# Patient Record
Sex: Female | Born: 1960 | Race: White | Hispanic: No | Marital: Married | Smoking: Never smoker
Health system: Southern US, Community
[De-identification: ages and names within clinical notes are randomized; demographics above are authoritative.]

## PROBLEM LIST (undated history)

## (undated) DIAGNOSIS — I639 Cerebral infarction, unspecified: Secondary | ICD-10-CM

## (undated) DIAGNOSIS — I10 Essential (primary) hypertension: Secondary | ICD-10-CM

## (undated) DIAGNOSIS — E7800 Pure hypercholesterolemia, unspecified: Secondary | ICD-10-CM

## (undated) DIAGNOSIS — G459 Transient cerebral ischemic attack, unspecified: Secondary | ICD-10-CM

---

## 1999-05-29 ENCOUNTER — Other Ambulatory Visit: Admission: RE | Admit: 1999-05-29 | Discharge: 1999-05-29 | Payer: Self-pay | Admitting: Obstetrics and Gynecology

## 1999-06-20 ENCOUNTER — Ambulatory Visit (HOSPITAL_COMMUNITY): Admission: RE | Admit: 1999-06-20 | Discharge: 1999-06-20 | Payer: Self-pay | Admitting: Obstetrics and Gynecology

## 2003-08-07 ENCOUNTER — Inpatient Hospital Stay (HOSPITAL_COMMUNITY): Admission: AD | Admit: 2003-08-07 | Discharge: 2003-08-10 | Payer: Self-pay | Admitting: Internal Medicine

## 2003-10-30 ENCOUNTER — Inpatient Hospital Stay (HOSPITAL_COMMUNITY): Admission: RE | Admit: 2003-10-30 | Discharge: 2003-11-04 | Payer: Self-pay | Admitting: Internal Medicine

## 2011-03-13 ENCOUNTER — Emergency Department (HOSPITAL_COMMUNITY)
Admission: EM | Admit: 2011-03-13 | Discharge: 2011-03-14 | Disposition: A | Discharge status: Home / Self Care | Payer: 59 | Attending: Emergency Medicine | Admitting: Emergency Medicine

## 2011-03-13 ENCOUNTER — Emergency Department (HOSPITAL_COMMUNITY): Payer: 59

## 2011-03-13 DIAGNOSIS — E785 Hyperlipidemia, unspecified: Secondary | ICD-10-CM | POA: Insufficient documentation

## 2011-03-13 DIAGNOSIS — R079 Chest pain, unspecified: Secondary | ICD-10-CM | POA: Insufficient documentation

## 2011-03-13 DIAGNOSIS — R0989 Other specified symptoms and signs involving the circulatory and respiratory systems: Secondary | ICD-10-CM | POA: Insufficient documentation

## 2011-03-13 DIAGNOSIS — M25519 Pain in unspecified shoulder: Secondary | ICD-10-CM | POA: Insufficient documentation

## 2011-03-13 DIAGNOSIS — R42 Dizziness and giddiness: Secondary | ICD-10-CM | POA: Insufficient documentation

## 2011-03-13 DIAGNOSIS — R05 Cough: Secondary | ICD-10-CM | POA: Insufficient documentation

## 2011-03-13 DIAGNOSIS — R5381 Other malaise: Secondary | ICD-10-CM | POA: Insufficient documentation

## 2011-03-13 DIAGNOSIS — R110 Nausea: Secondary | ICD-10-CM | POA: Insufficient documentation

## 2011-03-13 DIAGNOSIS — R0609 Other forms of dyspnea: Secondary | ICD-10-CM | POA: Insufficient documentation

## 2011-03-13 DIAGNOSIS — R059 Cough, unspecified: Secondary | ICD-10-CM | POA: Insufficient documentation

## 2011-03-13 DIAGNOSIS — R55 Syncope and collapse: Secondary | ICD-10-CM | POA: Insufficient documentation

## 2011-03-13 LAB — CBC
HCT: 38.5 % (ref 36.0–46.0)
MCH: 28.3 pg (ref 26.0–34.0)
MCHC: 33.5 g/dL (ref 30.0–36.0)
RBC: 4.56 MIL/uL (ref 3.87–5.11)
WBC: 10.9 10*3/uL — ABNORMAL HIGH (ref 4.0–10.5)

## 2011-03-13 LAB — BASIC METABOLIC PANEL
BUN: 9 mg/dL (ref 6–23)
Creatinine, Ser: 0.78 mg/dL (ref 0.50–1.10)
Glucose, Bld: 99 mg/dL (ref 70–99)
Potassium: 3.5 mEq/L (ref 3.5–5.1)
Sodium: 140 mEq/L (ref 135–145)

## 2011-03-13 LAB — TROPONIN I
Troponin I: <0.3 ng/mL (ref <0.30)
Troponin I: <0.3 ng/mL (ref <0.30)

## 2011-03-13 LAB — POCT PREGNANCY, URINE: Preg Test, Ur: NEGATIVE

## 2015-11-30 DIAGNOSIS — G459 Transient cerebral ischemic attack, unspecified: Secondary | ICD-10-CM

## 2015-11-30 HISTORY — DX: Transient cerebral ischemic attack, unspecified: G45.9

## 2016-04-29 ENCOUNTER — Emergency Department (HOSPITAL_COMMUNITY)
Admission: EM | Admit: 2016-04-29 | Discharge: 2016-04-29 | Disposition: A | Discharge status: Home / Self Care | Payer: Commercial Managed Care - HMO | Attending: Emergency Medicine | Admitting: Emergency Medicine

## 2016-04-29 ENCOUNTER — Emergency Department (HOSPITAL_COMMUNITY): Payer: Commercial Managed Care - HMO

## 2016-04-29 ENCOUNTER — Encounter (HOSPITAL_COMMUNITY): Payer: Self-pay | Admitting: Emergency Medicine

## 2016-04-29 DIAGNOSIS — R200 Anesthesia of skin: Secondary | ICD-10-CM | POA: Diagnosis not present

## 2016-04-29 DIAGNOSIS — Z7982 Long term (current) use of aspirin: Secondary | ICD-10-CM | POA: Diagnosis not present

## 2016-04-29 DIAGNOSIS — I10 Essential (primary) hypertension: Secondary | ICD-10-CM

## 2016-04-29 DIAGNOSIS — R791 Abnormal coagulation profile: Secondary | ICD-10-CM | POA: Diagnosis not present

## 2016-04-29 DIAGNOSIS — Z8673 Personal history of transient ischemic attack (TIA), and cerebral infarction without residual deficits: Secondary | ICD-10-CM | POA: Insufficient documentation

## 2016-04-29 HISTORY — DX: Transient cerebral ischemic attack, unspecified: G45.9

## 2016-04-29 LAB — COMPREHENSIVE METABOLIC PANEL
ALBUMIN: 4.2 g/dL (ref 3.5–5.0)
ALT: 15 U/L (ref 14–54)
AST: 18 U/L (ref 15–41)
Alkaline Phosphatase: 73 U/L (ref 38–126)
Anion gap: 9 (ref 5–15)
BUN: 13 mg/dL (ref 6–20)
CHLORIDE: 105 mmol/L (ref 101–111)
CO2: 25 mmol/L (ref 22–32)
CREATININE: 0.74 mg/dL (ref 0.44–1.00)
Calcium: 9.9 mg/dL (ref 8.9–10.3)
GFR calc Af Amer: >60 mL/min (ref >60)
GFR calc non Af Amer: >60 mL/min (ref >60)
GLUCOSE: 85 mg/dL (ref 65–99)
POTASSIUM: 3.9 mmol/L (ref 3.5–5.1)
SODIUM: 139 mmol/L (ref 135–145)
Total Bilirubin: 0.7 mg/dL (ref 0.3–1.2)
Total Protein: 6.7 g/dL (ref 6.5–8.1)

## 2016-04-29 LAB — DIFFERENTIAL
BASOS ABS: 0.1 10*3/uL (ref 0.0–0.1)
BASOS PCT: 1 %
EOS ABS: 0.1 10*3/uL (ref 0.0–0.7)
Eosinophils Relative: 1 %
Lymphocytes Relative: 33 %
Lymphs Abs: 3.2 10*3/uL (ref 0.7–4.0)
MONOS PCT: 6 %
Monocytes Absolute: 0.6 10*3/uL (ref 0.1–1.0)
NEUTROS ABS: 6 10*3/uL (ref 1.7–7.7)
NEUTROS PCT: 59 %

## 2016-04-29 LAB — I-STAT TROPONIN, ED: Troponin i, poc: 0 ng/mL (ref 0.00–0.08)

## 2016-04-29 LAB — PROTIME-INR
INR: 0.9
Prothrombin Time: 12.1 seconds (ref 11.4–15.2)

## 2016-04-29 LAB — CBC
HEMATOCRIT: 43.7 % (ref 36.0–46.0)
Hemoglobin: 13.7 g/dL (ref 12.0–15.0)
MCH: 27.8 pg (ref 26.0–34.0)
MCHC: 31.4 g/dL (ref 30.0–36.0)
MCV: 88.6 fL (ref 78.0–100.0)
PLATELETS: 311 10*3/uL (ref 150–400)
RBC: 4.93 MIL/uL (ref 3.87–5.11)
RDW: 12.8 % (ref 11.5–15.5)
WBC: 10 10*3/uL (ref 4.0–10.5)

## 2016-04-29 LAB — I-STAT CHEM 8, ED
BUN: 15 mg/dL (ref 6–20)
CHLORIDE: 104 mmol/L (ref 101–111)
Calcium, Ion: 1.2 mmol/L (ref 1.15–1.40)
Creatinine, Ser: 0.8 mg/dL (ref 0.44–1.00)
GLUCOSE: 87 mg/dL (ref 65–99)
HEMATOCRIT: 43 % (ref 36.0–46.0)
Hemoglobin: 14.6 g/dL (ref 12.0–15.0)
POTASSIUM: 4 mmol/L (ref 3.5–5.1)
Sodium: 140 mmol/L (ref 135–145)
TCO2: 27 mmol/L (ref 0–100)

## 2016-04-29 LAB — APTT: APTT: 28 s (ref 24–36)

## 2016-04-29 LAB — ETHANOL

## 2016-04-29 LAB — CBG MONITORING, ED: GLUCOSE-CAPILLARY: 94 mg/dL (ref 65–99)

## 2016-04-29 NOTE — ED Triage Notes (Signed)
Pt in from work, brought via [facility] after stroke-like symptoms began at 1000. Per EMS, pt stated having R sided facial numbness/tingling, sharp R sided HA and staggered gait at that time. Hx of TIA's, last one in April. Pt arrives alert, oriented, breathing with no difficulties.

## 2016-04-29 NOTE — ED Provider Notes (Signed)
[facility]EM[facility]GENCY DEPT Provider Note   CSN: [id number] Arrival date & time: 04/29/16  1034     History   Chief Complaint Chief Complaint  Patient presents with  . Transient Ischemic Attack    HPI Hailey Barrera is a 55 y.o. female.  Pt at work today and had some right sided facial numbness.  The pt said she became very worried and started breathing fast.  She said that she could not walk to the on site medical clinic and someone had to help her.  She called EMS who called a code stroke.  The pt said the same sx happened to her in April where she was diagnosed with a TIA.  She was admitted to [facility] had had a full stroke work up including MRI head, carotid u/s and echo.  She was diagnosed with htn and hypertriglyceridemia and has been on meds for those problems since d/c.  She has been followed closely by her pcp.  Pt did say that her bp was elevated at her work.      Past Medical History:  Diagnosis Date  . TIA (transient ischemic attack) 11/2015    There are no active problems to display for this patient.   No past surgical history on file.  OB History    No data available       Home Medications    Prior to Admission medications   Medication Sig Start Date End Date Taking? Authorizing Provider  aspirin EC 81 MG tablet Take 81 mg by mouth daily. 02/10/16  Yes Historical Provider, MD  cetirizine (ZYRTEC) 10 MG tablet Take 10 mg by mouth daily.   Yes Historical Provider, MD  Collagen 500 MG CAPS Take 500 mg by mouth daily.   Yes Historical Provider, MD  Boris Lown Oil 1000 MG CAPS Take 1,000 mg by mouth every evening.   Yes Historical Provider, MD  lisinopril (PRINIVIL,ZESTRIL) 2.5 MG tablet Take 2.5 mg by mouth daily.   Yes Historical Provider, MD    Family History No family history on file.  Social History Social History  Substance Use Topics  . Smoking status: Not on file  . Smokeless tobacco: Not on file  . Alcohol use Not on file     Allergies   Review of  patient's allergies indicates no known allergies.   Review of Systems Review of Systems  Neurological: Positive for numbness.  All other systems reviewed and are negative.    Physical Exam Updated Vital Signs BP 130/76   Pulse 69   Temp 98.8 F (37.1 C)   Resp 15   Ht 5\' 2"  (1.575 m)   Wt 167 lb 5.3 oz (75.9 kg)   LMP  (LMP Unknown)   SpO2 98%   BMI 30.60 kg/m   Physical Exam  Constitutional: She is oriented to person, place, and time. She appears well-developed and well-nourished.  HENT:  Head: Normocephalic and atraumatic.  Right Ear: External ear normal.  Left Ear: External ear normal.  Nose: Nose normal.  Mouth/Throat: Oropharynx is clear and moist.  Eyes: Conjunctivae and EOM are normal. Pupils are equal, round, and reactive to light.  Neck: Normal range of motion. Neck supple.  Cardiovascular: Normal rate, regular rhythm, normal heart sounds and intact distal pulses.   Pulmonary/Chest: Effort normal and breath sounds normal.  Abdominal: Soft. Bowel sounds are normal.  Musculoskeletal: Normal range of motion.  Neurological: She is alert and oriented to person, place, and time.  Right sided facial numbness  Skin: Skin is warm and dry.  Psychiatric: She has a normal mood and affect. Her behavior is normal. Judgment and thought content normal.  Nursing note and vitals reviewed.    ED Treatments / Results  Labs (all labs ordered are listed, but only abnormal results are displayed) Labs Reviewed  ETHANOL  PROTIME-INR  APTT  CBC  DIFFERENTIAL  COMPREHENSIVE METABOLIC PANEL  URINE RAPID DRUG SCREEN, HOSP PERFORMED  URINALYSIS, ROUTINE W REFLEX MICROSCOPIC (NOT AT [facility])  I-STAT CHEM 8, ED  I-STAT TROPOININ, ED  CBG MONITORING, ED    EKG  EKG Interpretation  Date/Time:  Wednesday April 29 2016 10:53:09 EDT Ventricular Rate:  78 PR Interval:    QRS Duration: 97 QT Interval:  375 QTC Calculation: 428 R Axis:   89 Text Interpretation:  Sinus rhythm  Confirmed by Lyndle Pang MD, Bryne Lindon ([id number]) on 04/29/2016 12:24:47 PM       Radiology Ct Head Code Stroke W/o Cm  Result Date: 04/29/2016 CLINICAL DATA:  Code stroke.  Right facial and arm numbness EXAM: CT HEAD WITHOUT CONTRAST TECHNIQUE: Contiguous axial images were obtained from the base of the skull through the vertex without intravenous contrast. COMPARISON:  MRI head 12/10/2015 FINDINGS: Ventricle size normal. Cerebral volume normal. Negative for acute or chronic infarction. Negative for hemorrhage or mass. No fluid collection. Normal calvarium. ASPECTS [facility]([place] Stroke Program Early CT Score) - Ganglionic level infarction (caudate, lentiform nuclei, internal capsule, insula, M1-M3 cortex): 7 - Supraganglionic infarction (M4-M6 cortex): 3 Total score (0-10 with 10 being normal): 10 IMPRESSION: 1. Negative CT head 2. ASPECTS is 10 Electronically Signed   By: Marlan Palauharles  Clark M.D.   On: 04/29/2016 10:52    Procedures Procedures (including critical care time)  Medications Ordered in ED Medications - No data to display   Initial Impression / Assessment and Plan / ED Course  I have reviewed the triage vital signs and the nursing notes.  Pertinent labs & imaging results that were available during my care of the patient were reviewed by me and considered in my medical decision making (see chart for details).  Clinical Course    Pt has no evidence of CVA.  She has had recent full work up.  Pt knows to f/u with her PCP.   Final Clinical Impressions(s) / ED Diagnoses   Final diagnoses:  Facial numbness  Essential hypertension    New Prescriptions New Prescriptions   No medications on file     Jacalyn LefevreJulie Zyaire Dumas, MD 04/29/16 1335

## 2016-04-29 NOTE — Code Documentation (Signed)
54[facility] at [id number].  Patient from work where she had sudden onset right facial numbness and tingling and right arm heaviness.  Patient reports h/o HTN and TIA in April with similar symptoms with symptoms resolving after 2.5 weeks.  Patient also reports headache this AM that she treated with Tylenol Sinus and resolved at 0600.  Patient also reporting chest heaviness.  Code stroke activated by EMS.  Stroke team at the bedside on patient arrival.  Labs drawn and patient to CT with team.  CT completed.  NIHSS 1, see documentation for details and code stroke times.  Patient with subjective right sided decreased sensation on exam.  No acute stroke treatment at this time.  Code stroke canceled.  Bedside handoff with ED RN Tobi BastosAnna.

## 2016-04-29 NOTE — Consult Note (Signed)
Neurology Consult Note  Reason for Consultation: CODE STROKE  Requesting provider: [facility]; ED MD [place]  CC: tingling in the right face and arm   HPI: This is a [age] year old right-handed woman who is brought in by EMS with tingling in the right face and arm. History is obtained directly from the patient was an excellent historian. I also spoke with the medics who brought her in. In addition, her medical record has been reviewed in detail.  The patient reports that she was at work this morning when she suddenly felt dizzy and lightheaded. This was accompanied by some tingling in the right face and arm. A coworker had to help her walk to the medical office at work where EMS was activated. Due to persistent symptoms, code stroke was activated and the patient was transported to [facility] department for further evaluation. She was taken for emergent CT scan of the head and this was unremarkable. She continued to have tingling in the right face and arm with NIH stroke scale score 1. Due to low NIH stroke scale score, very mild symptoms, and low suspicion for ischemia, thrombolytic therapy was not administered.   It is noteworthy that she had an episode of identical symptoms in April 2017. [facility]. At that time, she also experienced dizziness followed by numbness of the right side of her face and arm. She was taken to the emergency department. Workup included:    MRI scan of the brain which was interpreted as showing minimal white matter changes that were nonspecific in nature, no acute ischemia.   Transthoracic echocardiogram showed borderline LVH with ejection fraction 50-55% and a negative bubble study.   Carotid Dopplers showed no hemodynamically significant stenosis.   She was discharged home with instructions to take aspirin 81 mg daily and atorvastatin 20 mg daily. The patient reports that her numbness persisted for 2-1/2 weeks before completely  resolving. She has continued taking her aspirin but stopped at the atorvastatin because it made her feel bad. She describes feeling pain all over her body while taking this medication.  Last known well: 1000 NIH stroke score score: 1 TPA given?: No, low NIH stroke scale score, mild symptoms, low suspicion for ischemia  PMH:  1. HTN 2. "TIA" April 2017 with dizziness and numbness of the right face and arm  PSH:  1. Hysterectomy   Family history: HTN in both parents.   Social history:  She is married. She denies any tobacco, alcohol, or illicit drug use.   Allergies: Not on File  ROS: As per HPI. A full 14-point review of systems was performed and is otherwise notable for some occasional pain in her left knee.  PE:  BP 140/75   Pulse 80   Temp 98.8 F (37.1 C)   Resp 22   Ht 5\' 2"  (1.575 m)   Wt 75.9 kg (167 lb 5.3 oz)   LMP  (LMP Unknown)   SpO2 98%   BMI 30.60 kg/m   General: WDWN, no acute distress. AAO x4. Speech clear, no dysarthria. No aphasia. Follows commands briskly. Affect is bright with congruent mood. Comportment is normal.  HEENT: Normocephalic. Neck supple without LAD. MMM, OP clear. Dentition good. Sclerae anicteric. No conjunctival injection.  CV: Regular, no murmur. Carotid pulses full and symmetric, no bruits. Distal pulses 2+ and symmetric.  Lungs: CTAB.  Abdomen: Soft, non-distended, non-tender. Bowel sounds present x4.  Extremities: No C/C/E. Neuro:  CN: Pupils are equal and  round. They are symmetrically reactive from 3-->2 mm. EOMI without nystagmus. No reported diplopia. Facial sensation is intact to light touch. Face is symmetric at rest with normal strength and mobility. Hearing is intact to conversational voice. Palate elevates symmetrically and uvula is midline. Voice is normal in tone, pitch and quality. Bilateral SCM and trapezii are 5/5. Tongue is midline with normal bulk and mobility.  Motor: Normal bulk, tone, and strength. No tremor or other  abnormal movements. No drift.  Sensation: Pinprick is diminished in a patchy manner over the right side.Vibration is mildly reduced in both big toes. DTRs: 2+, symmetric. Toes downgoing bilaterally. No pathologic reflexes.  Coordination: Finger-to-nose and heel-to-shin are without dysmetria. Finger taps are normal in amplitude and speed, no decrement.    Labs:  Lab Results  Component Value Date   WBC 10.0 04/29/2016   HGB 14.6 04/29/2016   HCT 43.0 04/29/2016   PLT 311 04/29/2016   GLUCOSE 87 04/29/2016   NA 140 04/29/2016   K 4.0 04/29/2016   CL 104 04/29/2016   CREATININE 0.80 04/29/2016   BUN 15 04/29/2016   CO2 24 03/13/2011   INR 0.90 04/29/2016   Troponin 0.00  Imaging:  I have personally and independently reviewed the CT scan of the head without contrast from today. This is normal. This was discussed directly with the interpreting radiologist at the time of this consultation.   Assessment and Plan: 1. Right-sided numbness: This is acute. This is identical to symptoms that she experienced back in April 2017. Symptoms are preceded by dizziness and lightheadedness. At that time, she reports her symptoms lasted 2-1/2 weeks. The fact that she had an MRI scan that did not reveal any evidence of acute ischemia would argue against stroke and TIA as the cause for her symptoms. She continues to have subjective sensory changes over the right side without other objective findings on her examination. Given that she just had complete stroke workup just over 4 months ago, I do not think there is utility in repeating these tests at this time. Consideration could be given to checking labs such as TSH and folate. Ensure that she is well hydrated. She did have a headache earlier this morning that resolves before the onset of the symptoms, making it difficult to link the two. However, migraine variant could be considered. Continue aspirin, though again this seems unlikely to be due to cerebral  ischemia based on available information. Treatment is otherwise supportive.  This was discussed with the patient. She was reassured that there does not appear to be any evidence of an acute stroke at this time.

## 2017-07-05 ENCOUNTER — Emergency Department (HOSPITAL_COMMUNITY)
Admission: EM | Admit: 2017-07-05 | Discharge: 2017-07-05 | Disposition: A | Discharge status: Home / Self Care | Payer: 59 | Attending: Emergency Medicine | Admitting: Emergency Medicine

## 2017-07-05 ENCOUNTER — Emergency Department (HOSPITAL_COMMUNITY): Payer: 59

## 2017-07-05 ENCOUNTER — Encounter (HOSPITAL_COMMUNITY): Payer: Self-pay | Admitting: Emergency Medicine

## 2017-07-05 DIAGNOSIS — I10 Essential (primary) hypertension: Secondary | ICD-10-CM | POA: Insufficient documentation

## 2017-07-05 DIAGNOSIS — R079 Chest pain, unspecified: Secondary | ICD-10-CM | POA: Diagnosis not present

## 2017-07-05 DIAGNOSIS — Z79899 Other long term (current) drug therapy: Secondary | ICD-10-CM | POA: Insufficient documentation

## 2017-07-05 DIAGNOSIS — R200 Anesthesia of skin: Secondary | ICD-10-CM | POA: Insufficient documentation

## 2017-07-05 DIAGNOSIS — R202 Paresthesia of skin: Secondary | ICD-10-CM | POA: Diagnosis not present

## 2017-07-05 DIAGNOSIS — R0602 Shortness of breath: Secondary | ICD-10-CM | POA: Insufficient documentation

## 2017-07-05 DIAGNOSIS — R0789 Other chest pain: Secondary | ICD-10-CM

## 2017-07-05 DIAGNOSIS — R51 Headache: Secondary | ICD-10-CM | POA: Diagnosis present

## 2017-07-05 HISTORY — DX: Cerebral infarction, unspecified: I63.9

## 2017-07-05 HISTORY — DX: Essential (primary) hypertension: I10

## 2017-07-05 HISTORY — DX: Pure hypercholesterolemia, unspecified: E78.00

## 2017-07-05 LAB — CBC
HEMATOCRIT: 41.8 % (ref 36.0–46.0)
Hemoglobin: 13.7 g/dL (ref 12.0–15.0)
MCH: 27.7 pg (ref 26.0–34.0)
MCHC: 32.8 g/dL (ref 30.0–36.0)
MCV: 84.6 fL (ref 78.0–100.0)
Platelets: 288 10*3/uL (ref 150–400)
RBC: 4.94 MIL/uL (ref 3.87–5.11)
RDW: 13.7 % (ref 11.5–15.5)
WBC: 9.8 10*3/uL (ref 4.0–10.5)

## 2017-07-05 LAB — BASIC METABOLIC PANEL
ANION GAP: 6 (ref 5–15)
BUN: 13 mg/dL (ref 6–20)
CHLORIDE: 108 mmol/L (ref 101–111)
CO2: 26 mmol/L (ref 22–32)
Calcium: 9.5 mg/dL (ref 8.9–10.3)
Creatinine, Ser: 0.67 mg/dL (ref 0.44–1.00)
GFR calc Af Amer: >60 mL/min (ref >60)
GFR calc non Af Amer: >60 mL/min (ref >60)
GLUCOSE: 114 mg/dL — AB (ref 65–99)
POTASSIUM: 4.1 mmol/L (ref 3.5–5.1)
Sodium: 140 mmol/L (ref 135–145)

## 2017-07-05 LAB — I-STAT TROPONIN, ED
Troponin i, poc: 0 ng/mL (ref 0.00–0.08)
Troponin i, poc: 0 ng/mL (ref 0.00–0.08)

## 2017-07-05 NOTE — ED Triage Notes (Signed)
Was on her way to work and she had numbness rt side  And then  all over face and started to have cp and she had a h/a, pt states most s/s  has resolved still has residual numbness rt forehead and slight cp

## 2017-07-05 NOTE — ED Provider Notes (Signed)
[facility] EMERGENCY DEPARTMENT Provider Note   CSN: [id number] Arrival date & time: 07/05/17  0846     History   Chief Complaint Chief Complaint  Patient presents with  . Transient Ischemic Attack  . Chest Pain    HPI Hailey Barrera is a 56 y.o. female.  HPI Was driving to work and started to develop a mild frontal headache.  She thought it was a sinus headache coming on.  It did not get severe but then she started to feel like the left side of her face was getting pins and needles and numbness and then her right side of her face as well.  She reports all of a sudden her shoulders felt really heavy she reports that it was making her feel short of breath.  She reports she is not sure if she felt short of breath because she was getting anxious about the symptoms where it was associated with the symptoms.  The headache spontaneously resolved.  The other symptoms resolved as well but at work she did go to see the medical clinic.  Due to her prior history of TIA she was advised to come to the emergency department for assessment.  Reports she has been feeling well up to this point and did not have other symptoms leading up to this. Past Medical History:  Diagnosis Date  . High cholesterol   . Hypertension   . Stroke ([place])   . TIA (transient ischemic attack) 11/2015    There are no active problems to display for this patient.   History reviewed. No pertinent surgical history.  OB History    No data available       Home Medications    Prior to Admission medications   Medication Sig Start Date End Date Taking? Authorizing Provider  Ascorbic Acid (VITAMIN C PO) Take 1 tablet daily by mouth.   Yes [provider]  aspirin EC 81 MG tablet Take 81 mg by mouth daily. 02/10/16  Yes [provider]  cetirizine (ZYRTEC) 10 MG tablet Take 10 mg by mouth daily.   Yes [provider]  cholecalciferol (VITAMIN D) 400 units TABS tablet Take 400  Units daily by mouth.   Yes [provider]  Hypromellose (ARTIFICIAL TEARS OP) Place 1 drop daily as needed into both eyes (dry eyes).   Yes [provider]  lisinopril (PRINIVIL,ZESTRIL) 2.5 MG tablet Take 2.5 mg by mouth daily.   Yes [provider]  pravastatin (PRAVACHOL) 40 MG tablet Take 40 mg every other day by mouth.   Yes [provider]  pyridOXINE (VITAMIN B-6) 100 MG tablet Take 100 mg daily by mouth.   Yes [provider]  vitamin B-12 (CYANOCOBALAMIN) 100 MCG tablet Take 100 mcg daily by mouth.   Yes [provider]    Family History No family history on file.  Social History Social History   Tobacco Use  . Smoking status: Never Smoker  . Smokeless tobacco: Never Used  Substance Use Topics  . Alcohol use: No    Frequency: Never  . Drug use: No     Allergies   Patient has no known allergies.   Review of Systems Review of Systems 10 Systems reviewed and are negative for acute change except as noted in the HPI.   Physical Exam Updated Vital Signs BP 108/72   Pulse 68   Temp 98.4 F (36.9 C) (Oral)   Resp 13   LMP  (LMP  Unknown)   SpO2 97%   Physical Exam  Constitutional: She is oriented to person, place, and time. She appears well-developed and well-nourished. No distress.  HENT:  Head: Normocephalic and atraumatic.  Bilateral TM normal.  Oral cavity mucous memories pink moist.  Posterior oropharynx widely patent.  Nares patent no drainage or discharge.  Eyes: Conjunctivae and EOM are normal. Pupils are equal, round, and reactive to light.  Neck: Neck supple.  Cardiovascular: Normal rate, regular rhythm, normal heart sounds and intact distal pulses.  No murmur heard. Pulmonary/Chest: Effort normal and breath sounds normal. No respiratory distress.  Abdominal: Soft. There is no tenderness.  Musculoskeletal: Normal range of motion. She exhibits no edema, tenderness or deformity.  Neurological: She  is alert and oriented to person, place, and time. No cranial nerve deficit or sensory deficit. She exhibits normal muscle tone. Coordination normal.  Cranial nerves II through XII intact.  Normal finger nose exam bilaterally.  Normal heel-to-shin exam bilaterally.  5\5 motor strength upper and lower extremities symmetric.  Skin: Skin is warm and dry.  Psychiatric: She has a normal mood and affect.  Nursing note and vitals reviewed.    ED Treatments / Results  Labs (all labs ordered are listed, but only abnormal results are displayed) Labs Reviewed  BASIC METABOLIC PANEL - Abnormal; Notable for the following components:      Result Value   Glucose, Bld 114 (*)    All other components within normal limits  CBC  I-STAT TROPONIN, ED  I-STAT TROPONIN, ED    EKG  EKG Interpretation  Date/Time:  Monday July 05 2017 09:19:58 EST Ventricular Rate:  83 PR Interval:  126 QRS Duration: 90 QT Interval:  368 QTC Calculation: 432 R Axis:   75 Text Interpretation:  Normal sinus rhythm Normal ECG agree. no change  Confirmed by Charlesetta Shanks [phone]) on 07/05/2017 10:41:13 AM       Radiology Dg Chest 2 View  Result Date: 07/05/2017 CLINICAL DATA:  Shortness of breath.  Facial numbness EXAM: CHEST  2 VIEW COMPARISON:  December 10, 2015 FINDINGS: Lungs are clear. Heart size and pulmonary vascularity are normal. No adenopathy. No bone lesions. IMPRESSION: No edema or consolidation. Electronically Signed   By: Lowella Grip III M.D.   On: 07/05/2017 10:56   Ct Head Wo Contrast  Result Date: 07/05/2017 CLINICAL DATA:  Ptc/o her face going numb this morning, then she had chest pain and an odd pain in the left side of her neck This all occurred about 7 am and she feels much better now EXAM: CT HEAD WITHOUT CONTRAST TECHNIQUE: Contiguous axial images were obtained from the base of the skull through the vertex without intravenous contrast. COMPARISON:  Head -CT 04/29/2016 FINDINGS: Brain: No  acute intracranial hemorrhage. No focal mass lesion. No CT evidence of acute infarction. No midline shift or mass effect. No hydrocephalus. Basilar cisterns are patent. Vascular: No hyperdense vessel or unexpected calcification. Skull: Normal. Negative for fracture or focal lesion. Sinuses/Orbits: Paranasal sinuses and mastoid air cells are clear. Orbits are clear. Other: None. IMPRESSION: No acute intracranial findings. Electronically Signed   By: Suzy Bouchard M.D.   On: 07/05/2017 10:32    Procedures Procedures (including critical care time)  Medications Ordered in ED Medications - No data to display   Initial Impression / Assessment and Plan / ED Course  I have reviewed the triage vital signs and the nursing notes.  Pertinent labs & imaging results that were available during my  care of the patient were reviewed by me and considered in my medical decision making (see chart for details).     Final Clinical Impressions(s) / ED Diagnoses   Final diagnoses:  Atypical chest pain  Facial numbness  Patient episode as outlined above.  CT head is negative.  Frontal and central headache resolved spontaneously.  Description of paresthesia and numbness involving both sides of the face is atypical for CVA.  Patient did have an episode in 2017 that was evaluated for TIA.  The MRI from 2017 is negative and she had normal carotid and echo.  Patient felt a general heaviness over her shoulders.  2 sets of cardiac enzymes are negative and EKG is normal.  This time I feel patient stable to continue outpatient workup in light of her previous diagnostic evaluation last year.  Return precautions reviewed.  ED Discharge Orders    None       Pfeiffer, Marcy, MD 07/05/17 1347  

## 2019-11-12 ENCOUNTER — Other Ambulatory Visit: Payer: Self-pay

## 2019-11-12 ENCOUNTER — Emergency Department (HOSPITAL_COMMUNITY): Payer: 59

## 2019-11-12 ENCOUNTER — Observation Stay (HOSPITAL_COMMUNITY)
Admission: EM | Admit: 2019-11-12 | Discharge: 2019-11-12 | Disposition: A | Discharge status: Home / Self Care | Payer: 59 | Attending: Family Medicine | Admitting: Family Medicine

## 2019-11-12 ENCOUNTER — Encounter (HOSPITAL_COMMUNITY): Payer: Self-pay | Admitting: Emergency Medicine

## 2019-11-12 DIAGNOSIS — R531 Weakness: Secondary | ICD-10-CM

## 2019-11-12 DIAGNOSIS — Z6832 Body mass index (BMI) 32.0-32.9, adult: Secondary | ICD-10-CM | POA: Diagnosis not present

## 2019-11-12 DIAGNOSIS — I10 Essential (primary) hypertension: Secondary | ICD-10-CM | POA: Diagnosis not present

## 2019-11-12 DIAGNOSIS — E871 Hypo-osmolality and hyponatremia: Secondary | ICD-10-CM | POA: Insufficient documentation

## 2019-11-12 DIAGNOSIS — E669 Obesity, unspecified: Secondary | ICD-10-CM | POA: Diagnosis not present

## 2019-11-12 DIAGNOSIS — E785 Hyperlipidemia, unspecified: Secondary | ICD-10-CM | POA: Diagnosis not present

## 2019-11-12 DIAGNOSIS — Z7982 Long term (current) use of aspirin: Secondary | ICD-10-CM | POA: Insufficient documentation

## 2019-11-12 DIAGNOSIS — E7800 Pure hypercholesterolemia, unspecified: Secondary | ICD-10-CM | POA: Diagnosis not present

## 2019-11-12 DIAGNOSIS — Z79899 Other long term (current) drug therapy: Secondary | ICD-10-CM | POA: Insufficient documentation

## 2019-11-12 DIAGNOSIS — Z8673 Personal history of transient ischemic attack (TIA), and cerebral infarction without residual deficits: Secondary | ICD-10-CM | POA: Insufficient documentation

## 2019-11-12 DIAGNOSIS — Z8249 Family history of ischemic heart disease and other diseases of the circulatory system: Secondary | ICD-10-CM | POA: Diagnosis not present

## 2019-11-12 DIAGNOSIS — G459 Transient cerebral ischemic attack, unspecified: Principal | ICD-10-CM | POA: Diagnosis present

## 2019-11-12 LAB — I-STAT CHEM 8, ED
BUN: 14 mg/dL (ref 6–20)
Calcium, Ion: 1.11 mmol/L — ABNORMAL LOW (ref 1.15–1.40)
Chloride: 100 mmol/L (ref 98–111)
Creatinine, Ser: 0.7 mg/dL (ref 0.44–1.00)
Glucose, Bld: 106 mg/dL — ABNORMAL HIGH (ref 70–99)
HCT: 44 % (ref 36.0–46.0)
Hemoglobin: 15 g/dL (ref 12.0–15.0)
Potassium: 4.5 mmol/L (ref 3.5–5.1)
Sodium: 132 mmol/L — ABNORMAL LOW (ref 135–145)
TCO2: 24 mmol/L (ref 22–32)

## 2019-11-12 LAB — COMPREHENSIVE METABOLIC PANEL
ALT: 17 U/L (ref 0–44)
AST: 22 U/L (ref 15–41)
Albumin: 4.4 g/dL (ref 3.5–5.0)
Alkaline Phosphatase: 79 U/L (ref 38–126)
Anion gap: 11 (ref 5–15)
BUN: 13 mg/dL (ref 6–20)
CO2: 21 mmol/L — ABNORMAL LOW (ref 22–32)
Calcium: 9.6 mg/dL (ref 8.9–10.3)
Chloride: 99 mmol/L (ref 98–111)
Creatinine, Ser: 0.68 mg/dL (ref 0.44–1.00)
GFR calc Af Amer: >60 mL/min (ref >60)
GFR calc non Af Amer: >60 mL/min (ref >60)
Glucose, Bld: 93 mg/dL (ref 70–99)
Potassium: 4.3 mmol/L (ref 3.5–5.1)
Sodium: 131 mmol/L — ABNORMAL LOW (ref 135–145)
Total Bilirubin: 1.1 mg/dL (ref 0.3–1.2)
Total Protein: 7 g/dL (ref 6.5–8.1)

## 2019-11-12 LAB — DIFFERENTIAL
Abs Immature Granulocytes: 0.04 10*3/uL (ref 0.00–0.07)
Basophils Absolute: 0.1 10*3/uL (ref 0.0–0.1)
Basophils Relative: 1 %
Eosinophils Absolute: 0 10*3/uL (ref 0.0–0.5)
Eosinophils Relative: 0 %
Immature Granulocytes: 0 %
Lymphocytes Relative: 22 %
Lymphs Abs: 2.3 10*3/uL (ref 0.7–4.0)
Monocytes Absolute: 0.8 10*3/uL (ref 0.1–1.0)
Monocytes Relative: 8 %
Neutro Abs: 7.1 10*3/uL (ref 1.7–7.7)
Neutrophils Relative %: 69 %

## 2019-11-12 LAB — PROTIME-INR
INR: 1 (ref 0.8–1.2)
Prothrombin Time: 13.1 seconds (ref 11.4–15.2)

## 2019-11-12 LAB — CBC
HCT: 42.8 % (ref 36.0–46.0)
Hemoglobin: 13.9 g/dL (ref 12.0–15.0)
MCH: 28 pg (ref 26.0–34.0)
MCHC: 32.5 g/dL (ref 30.0–36.0)
MCV: 86.3 fL (ref 80.0–100.0)
Platelets: 329 10*3/uL (ref 150–400)
RBC: 4.96 MIL/uL (ref 3.87–5.11)
RDW: 12.7 % (ref 11.5–15.5)
WBC: 10.3 10*3/uL (ref 4.0–10.5)
nRBC: 0 % (ref 0.0–0.2)

## 2019-11-12 LAB — URINALYSIS, ROUTINE W REFLEX MICROSCOPIC
Bilirubin Urine: NEGATIVE
Glucose, UA: NEGATIVE mg/dL
Hgb urine dipstick: NEGATIVE
Ketones, ur: NEGATIVE mg/dL
Leukocytes,Ua: NEGATIVE
Nitrite: NEGATIVE
Protein, ur: NEGATIVE mg/dL
Specific Gravity, Urine: 1.021 (ref 1.005–1.030)
pH: 7 (ref 5.0–8.0)

## 2019-11-12 LAB — RAPID URINE DRUG SCREEN, HOSP PERFORMED
Amphetamines: NOT DETECTED
Barbiturates: NOT DETECTED
Benzodiazepines: NOT DETECTED
Cocaine: NOT DETECTED
Opiates: NOT DETECTED
Tetrahydrocannabinol: NOT DETECTED

## 2019-11-12 LAB — POC URINE PREG, ED: Preg Test, Ur: NEGATIVE

## 2019-11-12 LAB — ETHANOL: Alcohol, Ethyl (B): <10 mg/dL (ref <10)

## 2019-11-12 LAB — CBG MONITORING, ED: Glucose-Capillary: 100 mg/dL — ABNORMAL HIGH (ref 70–99)

## 2019-11-12 LAB — APTT: aPTT: 28 seconds (ref 24–36)

## 2019-11-12 MED ORDER — ALTEPLASE 100 MG IV SOLR
INTRAVENOUS | Status: AC
  Filled 2019-11-12: qty 100

## 2019-11-12 MED ORDER — ASPIRIN 325 MG PO TABS
325.0000 mg | ORAL_TABLET | Freq: Once | ORAL | Status: AC
  Administered 2019-11-12: 325 mg via ORAL
  Filled 2019-11-12: qty 1

## 2019-11-12 MED ORDER — IOHEXOL 350 MG/ML SOLN
115.0000 mL | Freq: Once | INTRAVENOUS | Status: AC | PRN
  Administered 2019-11-12: 115 mL via INTRAVENOUS

## 2019-11-12 MED ORDER — ASPIRIN EC 81 MG PO TBEC: 81.0000 mg | DELAYED_RELEASE_TABLET | Freq: Every day | ORAL | 0 refills | Status: AC

## 2019-11-12 MED ORDER — ATORVASTATIN CALCIUM 40 MG PO TABS
40.0000 mg | ORAL_TABLET | Freq: Once | ORAL | Status: AC
  Administered 2019-11-12: 40 mg via ORAL
  Filled 2019-11-12: qty 1

## 2019-11-12 NOTE — Progress Notes (Signed)
CODE STROKE 1101 phone call and beeper 1107 exam started 1109 exam finished, images to [facility] 1111exam compleded in Epic 1112 called [facility] spoke with Nedra Hai

## 2019-11-12 NOTE — ED Notes (Signed)
Code stroke paged out. Pt transported to CT. Telespecialist at bedside.

## 2019-11-12 NOTE — Discharge Instructions (Signed)
1)Please take Aspirin 81 mg daily along with Plavix 75 mg daily for 3 weeks, then after that STOP the aspirin and continue ONLY Plavix 75 mg daily indefinitely--for stroke prevention 2) please consider increasing Crestor to 15 mg daily for the next 3 to 4 weeks, if you tolerate the 15 mg dose well you may want to stay on it indefinitely after the initial 3 to 4 weeks--- this is for stroke prevention 3)Please follow-up with Neurologist Dr. Beryle Beams-- Phone: [phone], Address: [address], [place], [place] [address] in 3 to 4  weeks for recheck and reevaluation.  Please call to make appointment with him 4) you may benefit from outpatient Holter/event cardiac monitor to see if you have irregular heartbeat like atrial fibrillation that may put you at risk for TIA and strokes 5) your primary care physician or the neurologist Dr. Gerilyn Pilgrim can get an updated cardiac echocardiogram and may also be able to get a brain MRI if this is deemed necessary when you see them as outpatient 6) you will be taking both aspirin and Plavix which are blood thinners so Avoid ibuprofen/Advil/Aleve/Motrin/Goody Powders/Naproxen/BC powders/Meloxicam/Diclofenac/Indomethacin and other Nonsteroidal anti-inflammatory medications as these will make you more likely to bleed and can cause stomach ulcers, can also cause Kidney problems.

## 2019-11-12 NOTE — Consult Note (Addendum)
[facility] Tele[facility]rology Consult Services   Date of Service:   11/12/2019 11:04:57  Impression:     .  Possible Transient cerebral ischemic attack, unspecified  Comments/Sign-Out: 59 y/o woman with h/o HTN, hyperlipidemia, TIA who presents to the ED with generalized weakness. Patient also complains of leg cramping and finger cramping. NIHSS 0. CTA and CTP pending.  Metrics: Last Known Well: 11/11/2019 21:30:00 [facility] Notification Time: 11/12/2019 11:04:57 Arrival Time: 11/12/2019 10:46:00 Stamp Time: 11/12/2019 11:04:57 Time First Login Attempt: 11/12/2019 11:09:34 Symptoms: generalized weakness NIHSS Start Assessment Time: 11/12/2019 11:17:53 Patient is not a candidate for Alteplase/Activase. Alteplase Medical Decision: 11/12/2019 11:19:48 Patient was not deemed candidate for Alteplase/Activase thrombolytics because of Last Well Known Above 4.5 Hours.  CT head was reviewed.  Lower Likelihood of Large Vessel Occlusion but Following Stat Studies are Recommended  CTA Head and Neck. CT Perfusion.   ED Physician notified of diagnostic impression and management plan on 11/12/2019 11:27:42  Our recommendations are outlined below.  Recommendations:     .  Activate Stroke Protocol Admission/Order Set     .  Stroke/Telemetry Floor     .  Neuro Checks     .  Bedside Swallow Eval     .  DVT Prophylaxis     .  IV Fluids, Normal Saline     .  Head of Bed 30 Degrees     .  Euglycemia and Avoid Hyperthermia (PRN Acetaminophen)     .  ASA if no contraindications     .  Covid testing   Sign Out:     .  Discussed with Emergency Department Provider    ------------------------------------------------------------------------------  History of Present Illness: Patient is a 59 year old Female.  Patient was brought by EMS for symptoms of generalized weakness  59 y/o woman with h/o HTN, hyperlipidemia, TIA who presents to the ED with generalized  weakness. Patient also complains of leg cramping and finger cramping. Emergent telestroke consult requested. CT brain reviewed and case discussed with ED attending at bedside. Daughter at bedside. All questions answered. On ASA. Last reported normal prior to going to bed at 2130 as per patient. Patient said she was lightheaded when she woke up at 0630. NIHSS 0. Patient able to stand up and ambulates independently unassisted. CTA and CTP pending IV access.       Examination: BP(140/78), Pulse(87), Blood Glucose(100) 1A: Level of Consciousness - Alert; keenly responsive + 0 1B: Ask Month and Age - Both Questions Right + 0 1C: Blink Eyes & Squeeze Hands - Performs Both Tasks + 0 2: Test Horizontal Extraocular Movements - Normal + 0 3: Test Visual Fields - No Visual Loss + 0 4: Test Facial Palsy (Use Grimace if Obtunded) - Normal symmetry + 0 5A: Test Left Arm Motor Drift - No Drift for 10 Seconds + 0 5B: Test Right Arm Motor Drift - No Drift for 10 Seconds + 0 6A: Test Left Leg Motor Drift - No Drift for 5 Seconds + 0 6B: Test Right Leg Motor Drift - No Drift for 5 Seconds + 0 7: Test Limb Ataxia (FNF/Heel-Shin) - No Ataxia + 0 8: Test Sensation - Normal; No sensory loss + 0 9: Test Language/Aphasia - Normal; No aphasia + 0 10: Test Dysarthria - Normal + 0 11: Test Extinction/Inattention - No abnormality + 0  NIHSS Score: 0  Pre-Morbid Modified Ranking Scale: Unable to assess   Patient/Family was informed the Neurology Consult would occur via TeleHealth consult  by way of interactive audio and video telecommunications and consented to receiving care in this manner.   Due to the immediate potential for life-threatening deterioration due to underlying acute neurologic illness, I spent 20 minutes providing critical care. This time includes time for face to face visit via telemedicine, review of medical records, imaging studies and discussion of findings with providers, the patient and/or  family.   Dr Cleatrice Burke   [facility] [phone]  Case [id number]  Addendum 1221 - Advanced neuroimaging reviewed and discussed with the ED attending. No proximal intracranial LVO as per my review. Not a thrombectomy candidate. Formal radiology read pending and to be f/u by the ED attending. Please call with any questions.

## 2019-11-12 NOTE — ED Triage Notes (Signed)
Pt c/o of weakness, cramping in her hands. Was driving and had to pull over and call EMS

## 2019-11-12 NOTE — ED Notes (Signed)
Family at bedside. Family updated as to patient's status. 

## 2019-11-12 NOTE — ED Provider Notes (Signed)
[facility] EMERGENCY DEPARTMENT Provider Note   CSN: [id number] Arrival date & time: 11/12/19  1046  An emergency department physician performed an initial assessment on this suspected stroke patient at 1059.  History Chief Complaint  Patient presents with  . Weakness    Hailey Barrera is a 59 y.o. female with a history of hypertension, hypercholesterolemia, & prior TIA who presents to the emergency department via EMS with complaints of lightheadedness, weakness, & trouble speaking that began @ 0900 and seems to be worsening. Patient states she woke up in her normal state of health and while driving to church she started to feel generally weak, lightheaded like she might pass out, and felt she was having difficulty speaking. She describes the difficulty speaking as being slow and having trouble getting words out which is abnormal for her. She also mentions some nausea, hand cramping & trouble breathing. No alleviating/aggravating factors to her sxs. Denies visual disturbance, headache, dizziness like the room spitting, numbness, chest pain, abdominal pain, or emesis.    HPI     Past Medical History:  Diagnosis Date  . High cholesterol   . Hypertension   . Stroke (HCC)   . TIA (transient ischemic attack) 11/2015    There are no problems to display for this patient.   History reviewed. No pertinent surgical history.   OB History   No obstetric history on file.     No family history on file.  Social History   Tobacco Use  . Smoking status: Never Smoker  . Smokeless tobacco: Never Used  Substance Use Topics  . Alcohol use: No  . Drug use: No    Home Medications Prior to Admission medications   Medication Sig Start Date End Date Taking? Authorizing Provider  Ascorbic Acid (VITAMIN C PO) Take 1 tablet daily by mouth.    [provider]  aspirin EC 81 MG tablet Take 81 mg by mouth daily. 02/10/16   [provider]  cetirizine (ZYRTEC) 10 MG tablet  Take 10 mg by mouth daily.    [provider]  cholecalciferol (VITAMIN D) 400 units TABS tablet Take 400 Units daily by mouth.    [provider]  Hypromellose (ARTIFICIAL TEARS OP) Place 1 drop daily as needed into both eyes (dry eyes).    [provider]  lisinopril (PRINIVIL,ZESTRIL) 2.5 MG tablet Take 2.5 mg by mouth daily.    [provider]  pravastatin (PRAVACHOL) 40 MG tablet Take 40 mg every other day by mouth.    [provider]  pyridOXINE (VITAMIN B-6) 100 MG tablet Take 100 mg daily by mouth.    [provider]  vitamin B-12 (CYANOCOBALAMIN) 100 MCG tablet Take 100 mcg daily by mouth.    [provider]    Allergies    Patient has no known allergies.  Review of Systems   Review of Systems  Constitutional: Negative for chills and fever.  Respiratory: Positive for shortness of breath. Negative for cough.   Cardiovascular: Negative for chest pain.  Gastrointestinal: Positive for nausea. Negative for abdominal pain and vomiting.  Genitourinary: Negative for dysuria.  Neurological: Positive for speech difficulty, weakness (generalized) and light-headedness. Negative for dizziness, syncope and headaches.  All other systems reviewed and are negative.   Physical Exam Updated Vital Signs BP 140/78   Pulse 86   Temp 98.7 F (37.1 C) (Oral)   Resp 18   Ht 5' (1.524 m)   Wt 75.8 kg   LMP  (  LMP Unknown)   SpO2 98%   BMI 32.61 kg/m   Physical Exam Vitals and nursing note reviewed.  Constitutional:      Appearance: She is well-developed. She is not toxic-appearing.  HENT:     Head: Normocephalic and atraumatic.  Eyes:     General:        Right eye: No discharge.        Left eye: No discharge.     Conjunctiva/sclera: Conjunctivae normal.  Cardiovascular:     Rate and Rhythm: Normal rate and regular rhythm.  Pulmonary:     Effort: Pulmonary effort is normal. No respiratory distress.     Breath sounds:  Normal breath sounds. No wheezing, rhonchi or rales.  Abdominal:     General: There is no distension.     Palpations: Abdomen is soft.     Tenderness: There is no abdominal tenderness.  Musculoskeletal:     Cervical back: Neck supple.  Skin:    General: Skin is warm and dry.     Findings: No rash.  Neurological:     Mental Status: She is alert.     Comments: Patient has a degree of expressive aphasia on initial assessment, CN III-XII otherwise grossly intact. No facial droop. 5/5 symmetric grip strength & 5/5 strength with plantar/dorsiflexion bilaterally. Negative pronator drift. Intact finger to nose.   Psychiatric:     Comments: Somewhat tearful at times.      ED Results / Procedures / Treatments   Labs (all labs ordered are listed, but only abnormal results are displayed) Labs Reviewed  URINALYSIS, ROUTINE W REFLEX MICROSCOPIC - Abnormal; Notable for the following components:      Result Value   Color, Urine COLORLESS (*)    All other components within normal limits  COMPREHENSIVE METABOLIC PANEL - Abnormal; Notable for the following components:   Sodium 131 (*)    CO2 21 (*)    All other components within normal limits  I-STAT CHEM 8, ED - Abnormal; Notable for the following components:   Sodium 132 (*)    Glucose, Bld 106 (*)    Calcium, Ion 1.11 (*)    All other components within normal limits  CBG MONITORING, ED - Abnormal; Notable for the following components:   Glucose-Capillary 100 (*)    All other components within normal limits  CBC  DIFFERENTIAL  PROTIME-INR  APTT  RAPID URINE DRUG SCREEN, HOSP PERFORMED  ETHANOL  POC URINE PREG, ED    EKG EKG Interpretation  Date/Time:  Sunday November 12 2019 10:55:55 EDT Ventricular Rate:  81 PR Interval:    QRS Duration: 95 QT Interval:  360 QTC Calculation: 418 R Axis:   56 Text Interpretation: Sinus rhythm Low voltage, precordial leads Confirmed by Geoffery Lyons ([id number]) on 11/12/2019 10:58:46  AM   Radiology CT ANGIO HEAD W OR WO CONTRAST  Result Date: 11/12/2019 CLINICAL DATA:  Weakness and cramping in the hands EXAM: CT ANGIOGRAPHY HEAD AND NECK CT PERFUSION BRAIN TECHNIQUE: Multidetector CT imaging of the head and neck was performed using the standard protocol during bolus administration of intravenous contrast. Multiplanar CT image reconstructions and MIPs were obtained to evaluate the vascular anatomy. Carotid stenosis measurements (when applicable) are obtained utilizing NASCET criteria, using the distal internal carotid diameter as the denominator. Multiphase CT imaging of the brain was performed following IV bolus contrast injection. Subsequent parametric perfusion maps were calculated using RAPID software. CONTRAST:  OMNIPAQUE IOHEXOL 350 MG/ML SOLN COMPARISON:  None. FINDINGS:  CTA NECK FINDINGS Aortic arch: 2 vessel branching. Right carotid system: Vessels are smooth and widely patent. Minimal calcified plaque at the bifurcation. Left carotid system: Vessels are smooth and widely patent. Distal ICA tortuosity. Vertebral arteries: No proximal subclavian stenosis. The vertebral arteries are smooth and widely patent. Skeleton: No acute finding. Degenerative facet spurring asymmetric to the left. Other neck: No acute finding Upper chest: Negative Review of the MIP images confirms the above findings CTA HEAD FINDINGS Anterior circulation: Vessels are smooth and widely patent. No branch occlusion, beading, or aneurysm. Posterior circulation: Vertebral and basilar arteries are smooth and widely patent. Fetal type right PCA. Negative for aneurysm or beading Venous sinuses: Negative Anatomic variants: As above Review of the MIP images confirms the above findings CT Brain Perfusion Findings: ASPECTS: 10 CBF (<30%) Volume: 0mL Perfusion (Tmax>6.0s) volume: 0mL IMPRESSION: 1. No emergent finding.  Negative CT perfusion. 2. Minimal carotid atherosclerosis. Electronically Signed   By: Marnee SpringJonathon   Watts M.D.   On: 11/12/2019 12:26   CT ANGIO NECK W OR WO CONTRAST  Result Date: 11/12/2019 CLINICAL DATA:  Weakness and cramping in the hands EXAM: CT ANGIOGRAPHY HEAD AND NECK CT PERFUSION BRAIN TECHNIQUE: Multidetector CT imaging of the head and neck was performed using the standard protocol during bolus administration of intravenous contrast. Multiplanar CT image reconstructions and MIPs were obtained to evaluate the vascular anatomy. Carotid stenosis measurements (when applicable) are obtained utilizing NASCET criteria, using the distal internal carotid diameter as the denominator. Multiphase CT imaging of the brain was performed following IV bolus contrast injection. Subsequent parametric perfusion maps were calculated using RAPID software. CONTRAST:  115mL OMNIPAQUE IOHEXOL 350 MG/ML SOLN COMPARISON:  None. FINDINGS: CTA NECK FINDINGS Aortic arch: 2 vessel branching. Right carotid system: Vessels are smooth and widely patent. Minimal calcified plaque at the bifurcation. Left carotid system: Vessels are smooth and widely patent. Distal ICA tortuosity. Vertebral arteries: No proximal subclavian stenosis. The vertebral arteries are smooth and widely patent. Skeleton: No acute finding. Degenerative facet spurring asymmetric to the left. Other neck: No acute finding Upper chest: Negative Review of the MIP images confirms the above findings CTA HEAD FINDINGS Anterior circulation: Vessels are smooth and widely patent. No branch occlusion, beading, or aneurysm. Posterior circulation: Vertebral and basilar arteries are smooth and widely patent. Fetal type right PCA. Negative for aneurysm or beading Venous sinuses: Negative Anatomic variants: As above Review of the MIP images confirms the above findings CT Brain Perfusion Findings: ASPECTS: 10 CBF (<30%) Volume: 0mL Perfusion (Tmax>6.0s) volume: 0mL IMPRESSION: 1. No emergent finding.  Negative CT perfusion. 2. Minimal carotid atherosclerosis. Electronically Signed    By: Marnee SpringJonathon  Watts M.D.   On: 11/12/2019 12:26   CT CEREBRAL PERFUSION W CONTRAST  Result Date: 11/12/2019 CLINICAL DATA:  Weakness and cramping in the hands EXAM: CT ANGIOGRAPHY HEAD AND NECK CT PERFUSION BRAIN TECHNIQUE: Multidetector CT imaging of the head and neck was performed using the standard protocol during bolus administration of intravenous contrast. Multiplanar CT image reconstructions and MIPs were obtained to evaluate the vascular anatomy. Carotid stenosis measurements (when applicable) are obtained utilizing NASCET criteria, using the distal internal carotid diameter as the denominator. Multiphase CT imaging of the brain was performed following IV bolus contrast injection. Subsequent parametric perfusion maps were calculated using RAPID software. CONTRAST:  115mL OMNIPAQUE IOHEXOL 350 MG/ML SOLN COMPARISON:  None. FINDINGS: CTA NECK FINDINGS Aortic arch: 2 vessel branching. Right carotid system: Vessels are smooth and widely patent. Minimal calcified plaque at the  bifurcation. Left carotid system: Vessels are smooth and widely patent. Distal ICA tortuosity. Vertebral arteries: No proximal subclavian stenosis. The vertebral arteries are smooth and widely patent. Skeleton: No acute finding. Degenerative facet spurring asymmetric to the left. Other neck: No acute finding Upper chest: Negative Review of the MIP images confirms the above findings CTA HEAD FINDINGS Anterior circulation: Vessels are smooth and widely patent. No branch occlusion, beading, or aneurysm. Posterior circulation: Vertebral and basilar arteries are smooth and widely patent. Fetal type right PCA. Negative for aneurysm or beading Venous sinuses: Negative Anatomic variants: As above Review of the MIP images confirms the above findings CT Brain Perfusion Findings: ASPECTS: 10 CBF (<30%) Volume: 46mL Perfusion (Tmax>6.0s) volume: 73mL IMPRESSION: 1. No emergent finding.  Negative CT perfusion. 2. Minimal carotid atherosclerosis.  Electronically Signed   By: Marnee Spring M.D.   On: 11/12/2019 12:26   DG Chest Portable 1 View  Result Date: 11/12/2019 CLINICAL DATA:  Pt c/o of weakness, cramping in her hands. Was driving and had to pull over and call EMS. Pt denies any on body medical injector devices. EXAM: PORTABLE CHEST 1 VIEW COMPARISON:  07/06/2019 FINDINGS: Cardiac silhouette is normal in size. No mediastinal or hilar masses. Clear lungs.  No pleural effusion or pneumothorax. Skeletal structures are grossly intact. IMPRESSION: No active disease. Electronically Signed   By: Amie Portland M.D.   On: 11/12/2019 11:43   CT HEAD CODE STROKE WO CONTRAST  Result Date: 11/12/2019 CLINICAL DATA:  Code stroke.  Expressive aphasia EXAM: CT HEAD WITHOUT CONTRAST TECHNIQUE: Contiguous axial images were obtained from the base of the skull through the vertex without intravenous contrast. COMPARISON:  07/05/2017 FINDINGS: Brain: No evidence of acute infarction, hemorrhage, hydrocephalus, extra-axial collection or mass lesion/mass effect. Vascular: No hyperdense vessel or unexpected calcification. Skull: Normal. Negative for fracture or focal lesion. Sinuses/Orbits: Negative Other: Critical Value/emergent results were called by telephone at the time of interpretation on 11/12/2019 at 11:18 am to provider Delo , who verbally acknowledged these results. ASPECTS [facility] Stroke Program Early CT Score) - Ganglionic level infarction (caudate, lentiform nuclei, internal capsule, insula, M1-M3 cortex): 7 - Supraganglionic infarction (M4-M6 cortex): 3 Total score (0-10 with 10 being normal): 10 IMPRESSION: 1. Negative head CT. 2. ASPECTS is 10. Electronically Signed   By: Marnee Spring M.D.   On: 11/12/2019 11:19    Procedures Procedures (including critical care time)  Medications Ordered in ED Medications  alteplase (ACTIVASE) 1 mg/mL injection (has no administration in time range)    ED Course  I have reviewed the triage vital signs and  the nursing notes.  Pertinent labs & imaging results that were available during my care of the patient were reviewed by me and considered in my medical decision making (see chart for details).    MDM Rules/Calculators/A&P                      Patient presents to the ED with complaints of lightheadedness, generalized weakness, and difficulty speaking. She states she felt normal this AM to me with onset of sxs @ 0900 AM, given mild expressive aphasia on exam and within 2 hours of onset per patient report code stroke activated per discussion with Dr. Judd Lien.   CT head wo: Negative Evaluated by teleneurology- states not tPA candidate, ordered CTA/perfusion studies.   11:25: RE-EVAL: Patient with clear speech now, initial speech disturbance resolved she states she feels relatively back to normal in regards to her speech. Able  to further question who report some increased stress over taxes.   EKG: no STEMI CXR: No active disease CBC: no anemia/leukocytosis.  CMP: Mild hyponatremia and mildly low bicarb, no significant electrolyte derangement. Renal function preserved.  APTT/PT/INR: WNL Ethanol: WNL UA without infection Preg test negative  CTA/perfusion negative, Minimal carotid atherosclerosis.  Teleneurology Recommendations:     .  Activate Stroke Protocol Admission/Order Set     .  Stroke/Telemetry Floor     .  Neuro Checks     .  Bedside Swallow Eval     .  DVT Prophylaxis     .  IV Fluids, Normal Saline     .  Head of Bed 30 Degrees     .  Euglycemia and Avoid Hyperthermia (PRN Acetaminophen)     .  ASA if no contraindications     .  Covid testing  14:02: CONSULT: Discussed with hospitalist Dr. Roxan Hockey- states likely only needs echocardiogram in terms of inpatient stroke work-up, possibly admit vs. Discharge.   Bed request placed by hospitalist service.   I discussed results & treatment plan with patient & her daughter @ bedside- they are in agreement.   Findings and  plan of care discussed with supervising physician Dr. Stark Jock who has evaluated patient & is in agreement.   Final Clinical Impression(s) / ED Diagnoses Final diagnoses:  Weakness    Rx / DC Orders ED Discharge Orders    None       Amaryllis Dyke, PA-C 11/12/19 1539    Veryl Speak, MD 11/13/19 2318

## 2019-11-12 NOTE — Consult Note (Signed)
Patient Demographics:    Hailey Barrera, is a 59 y.o. female  MRN: [id number]   DOB - Jan 15, 1961  Admit Date - 11/12/2019  Outpatient Primary MD for the patient is Dolleschel, Irving Burton, FNP   Assessment & Plan:    Principal Problem:   TIA (transient ischemic attack) Active Problems:   HTN (hypertension)   Dyslipidemia, goal LDL below 70   Obesity (BMI 30-39.9)    1)Possible TIA--- patient neuro symptoms have resolved completely at the time of my evaluation  -Patient ambulating to the bathroom back-and-forth without any new neuro concerns  -Patient did not endorse anxiety/panic attack concerns -Telemetry neurology input appreciated  --patient gives a history of recurrent episodes similar to this  since 2017 apparently she was told she might be having recurrent TIAs -CT cerebral perfusion with contrast without acute findings -CT head neck and CTA head without acute findings-specifically no proximal LVO -CT head without contrast without acute stroke -Patient records from care everywhere reviewed with patient and daughter Ariel at bedside--carotid artery Doppler from 12/10/2015 without hemodynamically significant stenosis -Echo from 12/10/2015 with bubble study without acute findings, EF was 50 to 55% -Lipid profile remarkable for low HDL and high triglycerides, LDL from January 2020 was at goal (LDL =51) -Even if her lipid panel is within desired limits, patient should still take Crestor/Statin for it's Pleiotropic effects (beyond cholesterol lowering benefits) D/c Instructions:- 1)Please take Aspirin 81 mg daily along with Plavix 75 mg daily for 3 weeks, then after that STOP the aspirin and continue ONLY Plavix 75 mg daily indefinitely--for stroke prevention 2) please consider increasing Crestor to 15 mg daily for the  next 3 to 4 weeks, if you tolerate the 15 mg dose well you may want to stay on it indefinitely after the initial 3 to 4 weeks--- this is for stroke prevention 3)Please follow-up with Neurologist Dr. Beryle Beams-- Phone: [phone], Address: [address], [place], [place] [address] in 3 to 4  weeks for recheck and reevaluation.  Please call to make appointment with him 4) you may benefit from outpatient Holter/event cardiac monitor to see if you have irregular heartbeat like atrial fibrillation that may put you at risk for TIA and strokes 5) your primary care physician or the neurologist Dr. Gerilyn Pilgrim can get an updated cardiac echocardiogram and may also be able to get a brain MRI if this is deemed necessary when you see them as outpatient 6) you will be taking both aspirin and Plavix which are blood thinners so Avoid ibuprofen/Advil/Aleve/Motrin/Goody Powders/Naproxen/BC powders / Meloxicam / Diclofenac /Indomethacin and other Nonsteroidal anti-inflammatory medications as these will make you more likely to bleed and can cause stomach ulcers, can also cause Kidney problems.   2)HTN--stable, continue lisinopril  3) dyslipidemia--- management as above #1  4) obesity--- BMI 32, increase activity and reduce caloric intake will be beneficial  With History of - Reviewed by me  Past Medical History:  Diagnosis  Date  . High cholesterol   . Hypertension   . Stroke ([place])   . TIA (transient ischemic attack) 11/2015      Reviewed   Chief Complaint  Patient presents with  . Weakness      HPI:    Hailey Barrera  is a 59 y.o. female with past medical history relevant for dyslipidemia, HTN, and history of multiple episodes of recurrent TIA since 2017 who presents to the ED with concerns about generalized weakness, speech concerns as well as cramping of the legs and fingers -Patient was on Crestor and aspirin 81 mg PTA  -Patient was on her way driving to church when her neuro symptoms  all called and according to patient and her daughter Sharol Given who is at bedside symptoms lasted about 45 minutes -- Patient reported being dizzy/lightheaded around 630 this a.m. when she first woke up however while driving to church she had episode of speech concerns/difficulty finding words and worsening dizziness, associated with nausea, cramping of the arms and legs, as well as subjective dyspnea--- lasted about 30 to 40 minutes according to patient --Patient did not endorse anxiety/panic attack concerns  -Additional history obtained from patient's daughter Sharol Given who is at [facility] everywhere records reviewed with patient and daughter in the room at bedside--including prior work-up at [facility] and [place]).  -No chest pains no palpitations, no pleuritic symptoms -No headaches or visual disturbance.  No emesis or diarrhea  -Telemetry neurology consulted in ED -Neuro imaging studies as outlined in assessment and plan above    Review of systems:    In addition to the HPI above,   A full Review of  Systems was done, all other systems reviewed are negative except as noted above in HPI , .    Social History:  Reviewed by me    Social History   Tobacco Use  . Smoking status: Never Smoker  . Smokeless tobacco: Never Used  Substance Use Topics  . Alcohol use: No       Family History :  Reviewed by me  HTN  Home Medications:   Prior to Admission medications   Medication Sig Start Date End Date Taking? Authorizing Provider  Ascorbic Acid (VITAMIN C PO) Take 1 tablet daily by mouth.   Yes [provider]  cetirizine (ZYRTEC) 10 MG tablet Take 10 mg by mouth daily.   Yes [provider]  cholecalciferol (VITAMIN D) 400 units TABS tablet Take 400 Units daily by mouth.   Yes [provider]  Hypromellose (ARTIFICIAL TEARS OP) Place 1 drop daily as needed into both eyes (dry eyes).   Yes [provider]  lisinopril (PRINIVIL,ZESTRIL) 2.5 MG tablet Take 2.5 mg by mouth daily.   Yes [provider]  Multiple Vitamins-Minerals (HAIR SKIN AND NAILS FORMULA) TABS Take 1 tablet by mouth daily.   Yes [provider]  omega-3 acid ethyl esters (LOVAZA) 1 g capsule Take 1 g by mouth daily.   Yes [provider]  rosuvastatin (CRESTOR) 10 MG tablet Take 10 mg by mouth at bedtime. 10/17/19  Yes [provider]  vitamin B-12 (CYANOCOBALAMIN) 100 MCG tablet Take 100 mcg daily by mouth.   Yes [provider]  aspirin EC 81 MG tablet Take 1 tablet (81 mg total) by mouth daily with breakfast. 1)Please take Aspirin 81 mg daily along with Plavix 75 mg daily for weeks, then after that STOP the aspirin and continue ONLY  Plavix 75 mg daily indefinitely--for stroke prevention 11/12/19   Shon Hale, MD     Allergies:    No Known Allergies   Physical Exam:   Vitals  Blood pressure 117/77, pulse 99, temperature 98.5 F (36.9 C), resp. rate (!) 21, height 5' (1.524 m), weight 75.8 kg, SpO2 93 %.  Physical Examination: General appearance - alert, well appearing, and in no distress Mental status - alert, oriented to person, place, and time,  Eyes - sclera anicteric Neck - supple, no JVD elevation , Chest - clear  to auscultation bilaterally, symmetrical air movement,  Heart - S1 and S2 normal, regular  Abdomen - soft, nontender, nondistended, no masses or organomegaly Neurological - screening mental status exam normal, neck supple without rigidity, cranial nerves II through XII intact, DTR's normal and symmetric Extremities - no pedal edema noted, intact peripheral pulses  Skin - warm, dry     Data Review:    CBC Recent Labs  Lab 11/12/19 1100 11/12/19 1134  WBC 10.3  --   HGB 13.9 15.0  HCT 42.8 44.0  PLT 329  --   MCV 86.3  --   MCH 28.0  --   MCHC 32.5  --   RDW 12.7  --   LYMPHSABS 2.3  --   MONOABS 0.8  --   EOSABS 0.0  --     BASOSABS 0.1  --    ------------------------------------------------------------------------------------------------------------------  Chemistries  Recent Labs  Lab 11/12/19 1134 11/12/19 1243  NA 132* 131*  K 4.5 4.3  CL 100 99  CO2  --  21*  GLUCOSE 106* 93  BUN 14 13  CREATININE 0.70 0.68  CALCIUM  --  9.6  AST  --  22  ALT  --  17  ALKPHOS  --  79  BILITOT  --  1.1   ------------------------------------------------------------------------------------------------------------------ estimated creatinine clearance is 71.4 mL/min (by C-G formula based on SCr of 0.68 mg/dL). ------------------------------------------------------------------------------------------------------------------ No results for input(s): TSH, T4TOTAL, T3FREE, THYROIDAB in the last 72 hours.  Invalid input(s): FREET3   Coagulation profile Recent Labs  Lab 11/12/19 1243  INR 1.0   ------------------------------------------------------------------------------------------------------------------- No results for input(s): DDIMER in the last 72 hours. -------------------------------------------------------------------------------------------------------------------  Cardiac Enzymes No results for input(s): CKMB, TROPONINI, MYOGLOBIN in the last 168 hours.  Invalid input(s): CK ------------------------------------------------------------------------------------------------------------------ No results found for: BNP   ---------------------------------------------------------------------------------------------------------------  Urinalysis    Component Value Date/Time   COLORURINE COLORLESS (A) 11/12/2019 1305   APPEARANCEUR CLEAR 11/12/2019 1305   LABSPEC 1.021 11/12/2019 1305   PHURINE 7.0 11/12/2019 1305   GLUCOSEU NEGATIVE 11/12/2019 1305   HGBUR NEGATIVE 11/12/2019 1305   BILIRUBINUR NEGATIVE 11/12/2019 1305   KETONESUR NEGATIVE 11/12/2019 1305   PROTEINUR NEGATIVE 11/12/2019 1305    NITRITE NEGATIVE 11/12/2019 1305   LEUKOCYTESUR NEGATIVE 11/12/2019 1305    ----------------------------------------------------------------------------------------------------------------   Imaging Results:    CT ANGIO HEAD W OR WO CONTRAST  Result Date: 11/12/2019 CLINICAL DATA:  Weakness and cramping in the hands EXAM: CT ANGIOGRAPHY HEAD AND NECK CT PERFUSION BRAIN TECHNIQUE: Multidetector CT imaging of the head and neck was performed using the standard protocol during bolus administration of intravenous contrast. Multiplanar CT image reconstructions and MIPs were obtained to evaluate the vascular anatomy. Carotid stenosis measurements (when applicable) are obtained utilizing NASCET criteria, using the distal internal carotid diameter as the denominator. Multiphase CT imaging of the brain was performed following IV bolus contrast injection. Subsequent parametric perfusion maps were calculated using RAPID software. CONTRAST:  OMNIPAQUE IOHEXOL 350 MG/ML SOLN COMPARISON:  None. FINDINGS: CTA NECK FINDINGS Aortic arch: 2 vessel branching. Right carotid system: Vessels are smooth and widely patent. Minimal calcified plaque at the bifurcation. Left carotid system: Vessels are smooth and widely patent. Distal ICA tortuosity. Vertebral arteries: No proximal subclavian stenosis. The vertebral arteries are smooth and widely patent. Skeleton: No acute finding. Degenerative facet spurring asymmetric to the left. Other neck: No acute finding Upper chest: Negative Review of the MIP images confirms the above findings CTA HEAD FINDINGS Anterior circulation: Vessels are smooth and widely patent. No branch occlusion, beading, or aneurysm. Posterior circulation: Vertebral and basilar arteries are smooth and widely patent. Fetal type right PCA. Negative for aneurysm or beading Venous sinuses: Negative Anatomic variants: As above Review of the MIP images confirms the above findings CT Brain Perfusion Findings:  ASPECTS: 10 CBF (<30%) Volume: 0mL Perfusion (Tmax>6.0s) volume: 0mL IMPRESSION: 1. No emergent finding.  Negative CT perfusion. 2. Minimal carotid atherosclerosis. Electronically Signed   By: Marnee Spring M.D.   On: 11/12/2019 12:26   CT ANGIO NECK W OR WO CONTRAST  Result Date: 11/12/2019 CLINICAL DATA:  Weakness and cramping in the hands EXAM: CT ANGIOGRAPHY HEAD AND NECK CT PERFUSION BRAIN TECHNIQUE: Multidetector CT imaging of the head and neck was performed using the standard protocol during bolus administration of intravenous contrast. Multiplanar CT image reconstructions and MIPs were obtained to evaluate the vascular anatomy. Carotid stenosis measurements (when applicable) are obtained utilizing NASCET criteria, using the distal internal carotid diameter as the denominator. Multiphase CT imaging of the brain was performed following IV bolus contrast injection. Subsequent parametric perfusion maps were calculated using RAPID software. CONTRAST:  OMNIPAQUE IOHEXOL 350 MG/ML SOLN COMPARISON:  None. FINDINGS: CTA NECK FINDINGS Aortic arch: 2 vessel branching. Right carotid system: Vessels are smooth and widely patent. Minimal calcified plaque at the bifurcation. Left carotid system: Vessels are smooth and widely patent. Distal ICA tortuosity. Vertebral arteries: No proximal subclavian stenosis. The vertebral arteries are smooth and widely patent. Skeleton: No acute finding. Degenerative facet spurring asymmetric to the left. Other neck: No acute finding Upper chest: Negative Review of the MIP images confirms the above findings CTA HEAD FINDINGS Anterior circulation: Vessels are smooth and widely patent. No branch occlusion, beading, or aneurysm. Posterior circulation: Vertebral and basilar arteries are smooth and widely patent. Fetal type right PCA. Negative for aneurysm or beading Venous sinuses: Negative Anatomic variants: As above Review of the MIP images confirms the above findings CT Brain  Perfusion Findings: ASPECTS: 10 CBF (<30%) Volume: 0mL Perfusion (Tmax>6.0s) volume: 0mL IMPRESSION: 1. No emergent finding.  Negative CT perfusion. 2. Minimal carotid atherosclerosis. Electronically Signed   By: Marnee Spring M.D.   On: 11/12/2019 12:26   CT CEREBRAL PERFUSION W CONTRAST  Result Date: 11/12/2019 CLINICAL DATA:  Weakness and cramping in the hands EXAM: CT ANGIOGRAPHY HEAD AND NECK CT PERFUSION BRAIN TECHNIQUE: Multidetector CT imaging of the head and neck was performed using the standard protocol during bolus administration of intravenous contrast. Multiplanar CT image reconstructions and MIPs were obtained to evaluate the vascular anatomy. Carotid stenosis measurements (when applicable) are obtained utilizing NASCET criteria, using the distal internal carotid diameter as the denominator. Multiphase CT imaging of the brain was performed following IV bolus contrast injection. Subsequent parametric perfusion maps were calculated using RAPID software. CONTRAST:  OMNIPAQUE IOHEXOL 350 MG/ML SOLN COMPARISON:  None. FINDINGS: CTA NECK FINDINGS Aortic arch: 2 vessel branching. Right carotid system: Vessels  are smooth and widely patent. Minimal calcified plaque at the bifurcation. Left carotid system: Vessels are smooth and widely patent. Distal ICA tortuosity. Vertebral arteries: No proximal subclavian stenosis. The vertebral arteries are smooth and widely patent. Skeleton: No acute finding. Degenerative facet spurring asymmetric to the left. Other neck: No acute finding Upper chest: Negative Review of the MIP images confirms the above findings CTA HEAD FINDINGS Anterior circulation: Vessels are smooth and widely patent. No branch occlusion, beading, or aneurysm. Posterior circulation: Vertebral and basilar arteries are smooth and widely patent. Fetal type right PCA. Negative for aneurysm or beading Venous sinuses: Negative Anatomic variants: As above Review of the MIP images confirms the above  findings CT Brain Perfusion Findings: ASPECTS: 10 CBF (<30%) Volume: 0mL Perfusion (Tmax>6.0s) volume: 0mL IMPRESSION: 1. No emergent finding.  Negative CT perfusion. 2. Minimal carotid atherosclerosis. Electronically Signed   By: Marnee SpringJonathon  Watts M.D.   On: 11/12/2019 12:26   DG Chest Portable 1 View  Result Date: 11/12/2019 CLINICAL DATA:  Pt c/o of weakness, cramping in her hands. Was driving and had to pull over and call EMS. Pt denies any on body medical injector devices. EXAM: PORTABLE CHEST 1 VIEW COMPARISON:  07/06/2019 FINDINGS: Cardiac silhouette is normal in size. No mediastinal or hilar masses. Clear lungs.  No pleural effusion or pneumothorax. Skeletal structures are grossly intact. IMPRESSION: No active disease. Electronically Signed   By: Amie Portlandavid  Ormond M.D.   On: 11/12/2019 11:43   CT HEAD CODE STROKE WO CONTRAST  Result Date: 11/12/2019 CLINICAL DATA:  Code stroke.  Expressive aphasia EXAM: CT HEAD WITHOUT CONTRAST TECHNIQUE: Contiguous axial images were obtained from the base of the skull through the vertex without intravenous contrast. COMPARISON:  07/05/2017 FINDINGS: Brain: No evidence of acute infarction, hemorrhage, hydrocephalus, extra-axial collection or mass lesion/mass effect. Vascular: No hyperdense vessel or unexpected calcification. Skull: Normal. Negative for fracture or focal lesion. Sinuses/Orbits: Negative Other: Critical Value/emergent results were called by telephone at the time of interpretation on 11/12/2019 at 11:18 am to provider Delo , who verbally acknowledged these results. ASPECTS [facility]([place] Stroke Program Early CT Score) - Ganglionic level infarction (caudate, lentiform nuclei, internal capsule, insula, M1-M3 cortex): 7 - Supraganglionic infarction (M4-M6 cortex): 3 Total score (0-10 with 10 being normal): 10 IMPRESSION: 1. Negative head CT. 2. ASPECTS is 10. Electronically Signed   By: Marnee SpringJonathon  Watts M.D.   On: 11/12/2019 11:19    Radiological Exams on  Admission: CT ANGIO HEAD W OR WO CONTRAST  Result Date: 11/12/2019 CLINICAL DATA:  Weakness and cramping in the hands EXAM: CT ANGIOGRAPHY HEAD AND NECK CT PERFUSION BRAIN TECHNIQUE: Multidetector CT imaging of the head and neck was performed using the standard protocol during bolus administration of intravenous contrast. Multiplanar CT image reconstructions and MIPs were obtained to evaluate the vascular anatomy. Carotid stenosis measurements (when applicable) are obtained utilizing NASCET criteria, using the distal internal carotid diameter as the denominator. Multiphase CT imaging of the brain was performed following IV bolus contrast injection. Subsequent parametric perfusion maps were calculated using RAPID software. CONTRAST:  115mL OMNIPAQUE IOHEXOL 350 MG/ML SOLN COMPARISON:  None. FINDINGS: CTA NECK FINDINGS Aortic arch: 2 vessel branching. Right carotid system: Vessels are smooth and widely patent. Minimal calcified plaque at the bifurcation. Left carotid system: Vessels are smooth and widely patent. Distal ICA tortuosity. Vertebral arteries: No proximal subclavian stenosis. The vertebral arteries are smooth and widely patent. Skeleton: No acute finding. Degenerative facet spurring asymmetric to the left. Other neck: No  acute finding Upper chest: Negative Review of the MIP images confirms the above findings CTA HEAD FINDINGS Anterior circulation: Vessels are smooth and widely patent. No branch occlusion, beading, or aneurysm. Posterior circulation: Vertebral and basilar arteries are smooth and widely patent. Fetal type right PCA. Negative for aneurysm or beading Venous sinuses: Negative Anatomic variants: As above Review of the MIP images confirms the above findings CT Brain Perfusion Findings: ASPECTS: 10 CBF (<30%) Volume: 76mL Perfusion (Tmax>6.0s) volume: 24mL IMPRESSION: 1. No emergent finding.  Negative CT perfusion. 2. Minimal carotid atherosclerosis. Electronically Signed   By: Marnee Spring  M.D.   On: 11/12/2019 12:26   CT ANGIO NECK W OR WO CONTRAST  Result Date: 11/12/2019 CLINICAL DATA:  Weakness and cramping in the hands EXAM: CT ANGIOGRAPHY HEAD AND NECK CT PERFUSION BRAIN TECHNIQUE: Multidetector CT imaging of the head and neck was performed using the standard protocol during bolus administration of intravenous contrast. Multiplanar CT image reconstructions and MIPs were obtained to evaluate the vascular anatomy. Carotid stenosis measurements (when applicable) are obtained utilizing NASCET criteria, using the distal internal carotid diameter as the denominator. Multiphase CT imaging of the brain was performed following IV bolus contrast injection. Subsequent parametric perfusion maps were calculated using RAPID software. CONTRAST:  OMNIPAQUE IOHEXOL 350 MG/ML SOLN COMPARISON:  None. FINDINGS: CTA NECK FINDINGS Aortic arch: 2 vessel branching. Right carotid system: Vessels are smooth and widely patent. Minimal calcified plaque at the bifurcation. Left carotid system: Vessels are smooth and widely patent. Distal ICA tortuosity. Vertebral arteries: No proximal subclavian stenosis. The vertebral arteries are smooth and widely patent. Skeleton: No acute finding. Degenerative facet spurring asymmetric to the left. Other neck: No acute finding Upper chest: Negative Review of the MIP images confirms the above findings CTA HEAD FINDINGS Anterior circulation: Vessels are smooth and widely patent. No branch occlusion, beading, or aneurysm. Posterior circulation: Vertebral and basilar arteries are smooth and widely patent. Fetal type right PCA. Negative for aneurysm or beading Venous sinuses: Negative Anatomic variants: As above Review of the MIP images confirms the above findings CT Brain Perfusion Findings: ASPECTS: 10 CBF (<30%) Volume: 58mL Perfusion (Tmax>6.0s) volume: 20mL IMPRESSION: 1. No emergent finding.  Negative CT perfusion. 2. Minimal carotid atherosclerosis. Electronically Signed   By:  Marnee Spring M.D.   On: 11/12/2019 12:26   CT CEREBRAL PERFUSION W CONTRAST  Result Date: 11/12/2019 CLINICAL DATA:  Weakness and cramping in the hands EXAM: CT ANGIOGRAPHY HEAD AND NECK CT PERFUSION BRAIN TECHNIQUE: Multidetector CT imaging of the head and neck was performed using the standard protocol during bolus administration of intravenous contrast. Multiplanar CT image reconstructions and MIPs were obtained to evaluate the vascular anatomy. Carotid stenosis measurements (when applicable) are obtained utilizing NASCET criteria, using the distal internal carotid diameter as the denominator. Multiphase CT imaging of the brain was performed following IV bolus contrast injection. Subsequent parametric perfusion maps were calculated using RAPID software. CONTRAST:  OMNIPAQUE IOHEXOL 350 MG/ML SOLN COMPARISON:  None. FINDINGS: CTA NECK FINDINGS Aortic arch: 2 vessel branching. Right carotid system: Vessels are smooth and widely patent. Minimal calcified plaque at the bifurcation. Left carotid system: Vessels are smooth and widely patent. Distal ICA tortuosity. Vertebral arteries: No proximal subclavian stenosis. The vertebral arteries are smooth and widely patent. Skeleton: No acute finding. Degenerative facet spurring asymmetric to the left. Other neck: No acute finding Upper chest: Negative Review of the MIP images confirms the above findings CTA HEAD FINDINGS Anterior circulation: Vessels are smooth  and widely patent. No branch occlusion, beading, or aneurysm. Posterior circulation: Vertebral and basilar arteries are smooth and widely patent. Fetal type right PCA. Negative for aneurysm or beading Venous sinuses: Negative Anatomic variants: As above Review of the MIP images confirms the above findings CT Brain Perfusion Findings: ASPECTS: 10 CBF (<30%) Volume: 73mL Perfusion (Tmax>6.0s) volume: 74mL IMPRESSION: 1. No emergent finding.  Negative CT perfusion. 2. Minimal carotid atherosclerosis.  Electronically Signed   By: Marnee Spring M.D.   On: 11/12/2019 12:26   DG Chest Portable 1 View  Result Date: 11/12/2019 CLINICAL DATA:  Pt c/o of weakness, cramping in her hands. Was driving and had to pull over and call EMS. Pt denies any on body medical injector devices. EXAM: PORTABLE CHEST 1 VIEW COMPARISON:  07/06/2019 FINDINGS: Cardiac silhouette is normal in size. No mediastinal or hilar masses. Clear lungs.  No pleural effusion or pneumothorax. Skeletal structures are grossly intact. IMPRESSION: No active disease. Electronically Signed   By: Amie Portland M.D.   On: 11/12/2019 11:43   CT HEAD CODE STROKE WO CONTRAST  Result Date: 11/12/2019 CLINICAL DATA:  Code stroke.  Expressive aphasia EXAM: CT HEAD WITHOUT CONTRAST TECHNIQUE: Contiguous axial images were obtained from the base of the skull through the vertex without intravenous contrast. COMPARISON:  07/05/2017 FINDINGS: Brain: No evidence of acute infarction, hemorrhage, hydrocephalus, extra-axial collection or mass lesion/mass effect. Vascular: No hyperdense vessel or unexpected calcification. Skull: Normal. Negative for fracture or focal lesion. Sinuses/Orbits: Negative Other: Critical Value/emergent results were called by telephone at the time of interpretation on 11/12/2019 at 11:18 am to provider Delo , who verbally acknowledged these results. ASPECTS [facility] Stroke Program Early CT Score) - Ganglionic level infarction (caudate, lentiform nuclei, internal capsule, insula, M1-M3 cortex): 7 - Supraganglionic infarction (M4-M6 cortex): 3 Total score (0-10 with 10 being normal): 10 IMPRESSION: 1. Negative head CT. 2. ASPECTS is 10. Electronically Signed   By: Marnee Spring M.D.   On: 11/12/2019 11:19    AM Labs Ordered, also please review Full Orders  Family Communication: Admission, patients condition and plan of care including tests being ordered have been discussed with the patient and daughter Kathlen Mody who indicate understanding and  agree with the plan   Code Status - Full Code  Likely DC to  Home   Condition   stable  Shon Hale M.D on 11/12/2019 at 4:31 PM Go to [email or web address] -  for contact info  [facility] - Office  [phone]

## 2020-04-18 IMAGING — CT CT HEAD CODE STROKE
3 series · 15 of 47 positions shown, 18 images · non-contrast
Comparison: 07/05/2017

CLINICAL DATA: Code stroke.  Expressive aphasia

EXAM:
CT HEAD WITHOUT CONTRAST
TECHNIQUE: Contiguous axial images were obtained from the base of the skull
through the vertex without intravenous contrast.

[Series 2: head w o · axial · 0.43mm/px · z∈[+39,+164]mm · 9 of 30 slices shown, 12 images]
[im 3/30  brain]
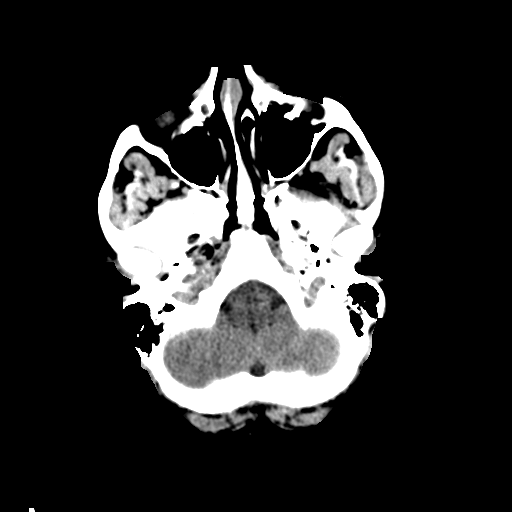
[im 3/30  bone]
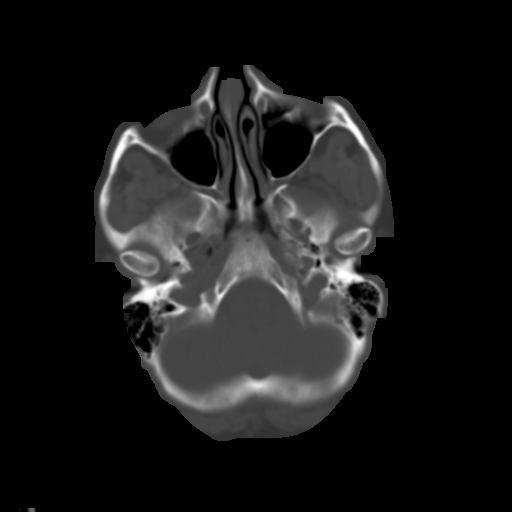
[im 6/30  brain]
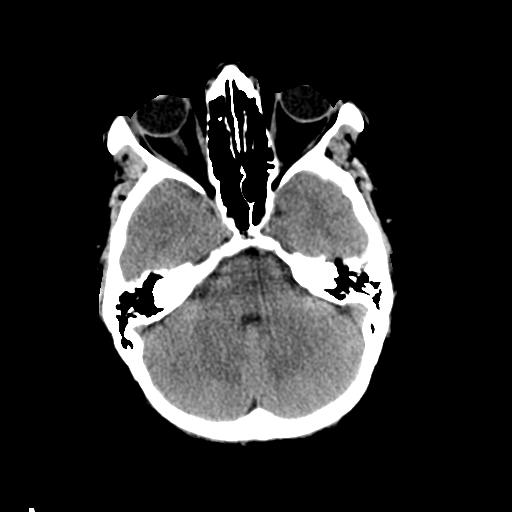
[im 9/30  brain]
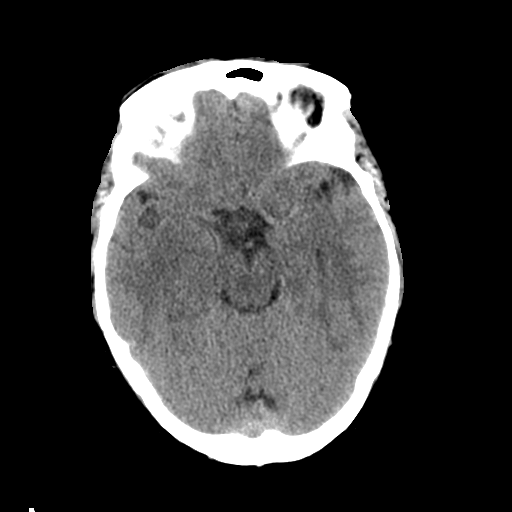
[im 12/30  brain]
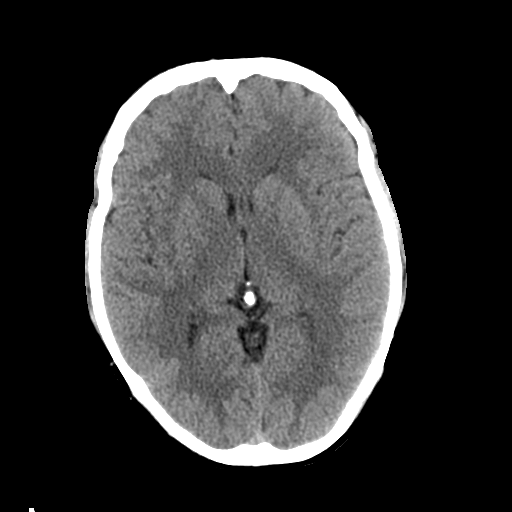
[im 16/30  brain]
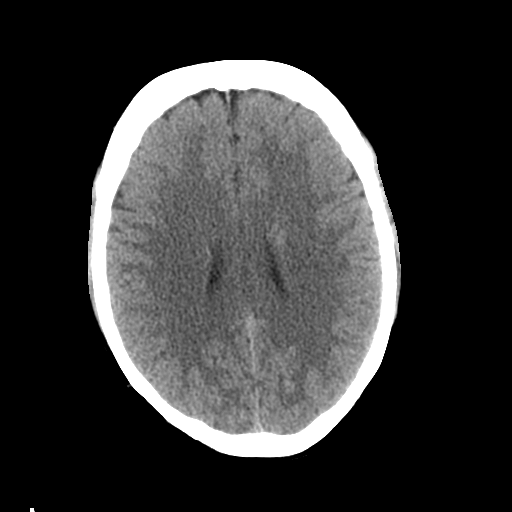
[im 16/30  bone]
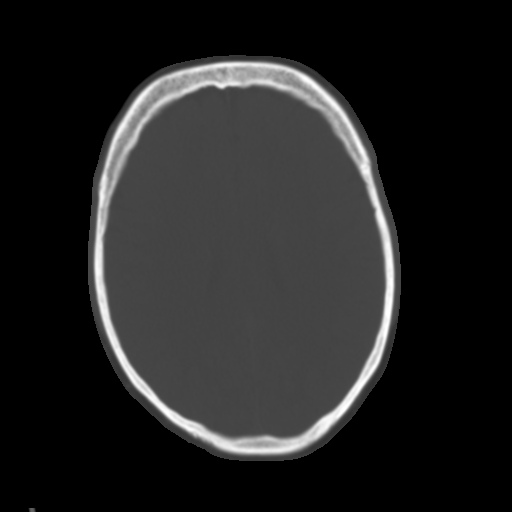
[im 19/30  brain]
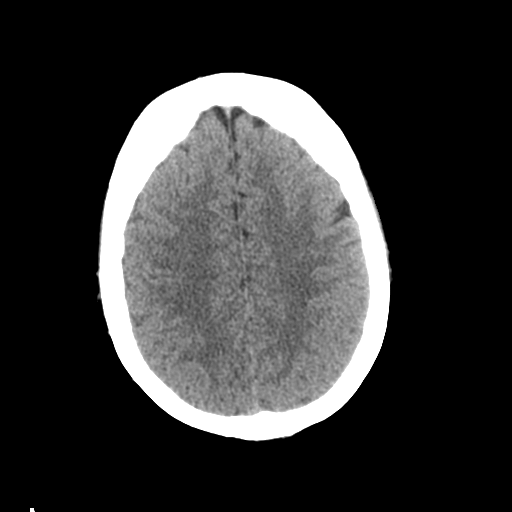
[im 22/30  brain]
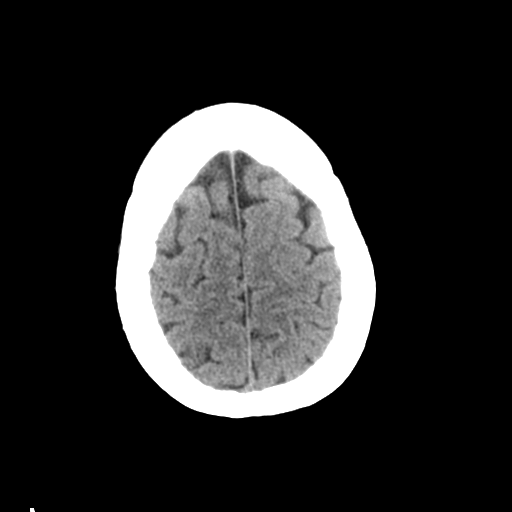
[im 25/30  brain]
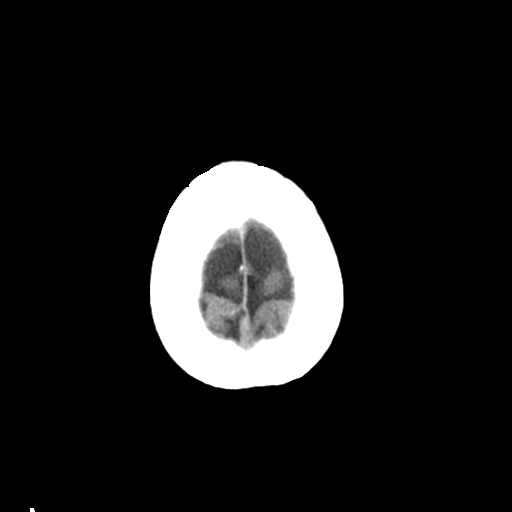
[im 28/30  brain]
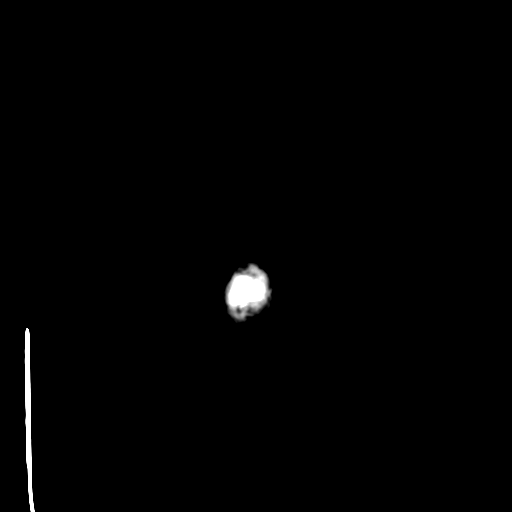
[im 28/30  bone]
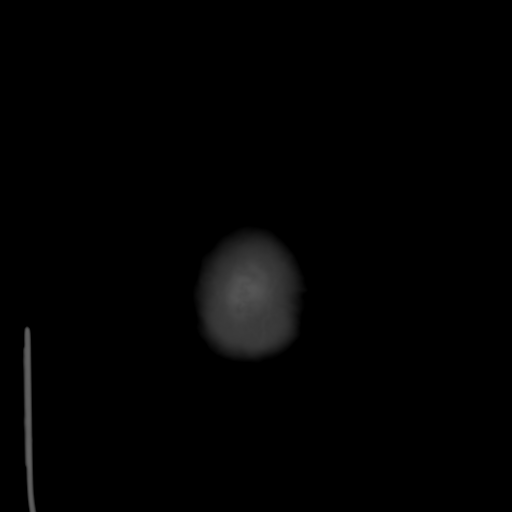

[Series 4: coronal soft · coronal · 0.32mm/px · 3 of 67 slices shown]
[im 23/67  brain]
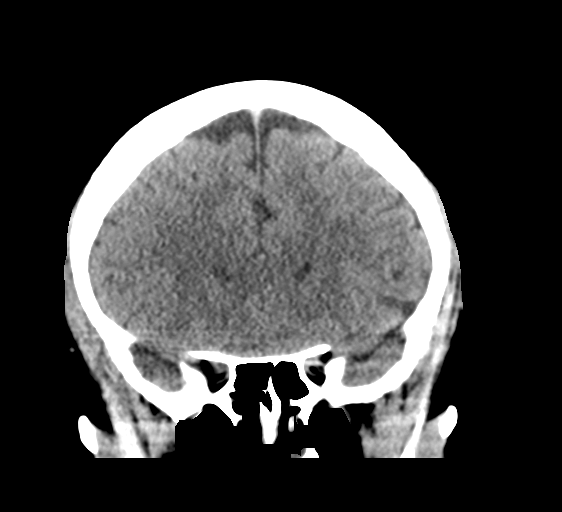
[im 30/67  brain]
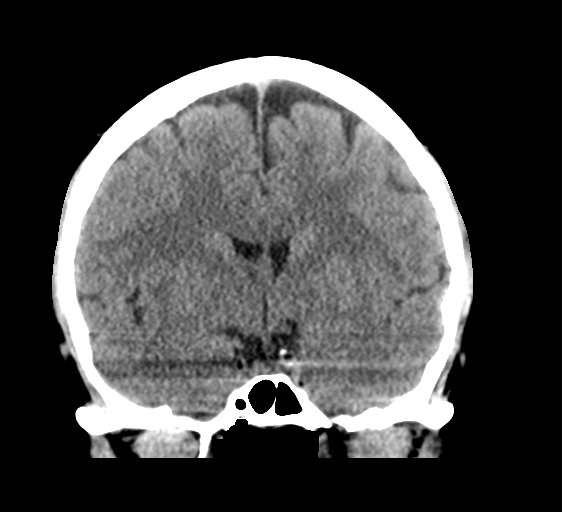
[im 37/67  brain]
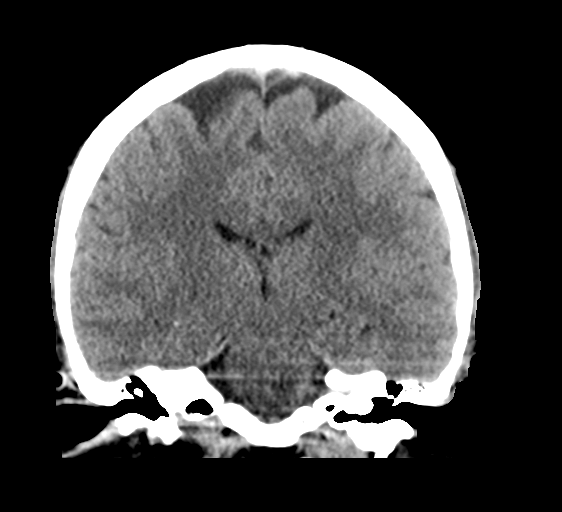

[Series 5: sagittal soft · sagittal · 0.30mm/px · 3 of 52 slices shown]
[im 18/52  brain]
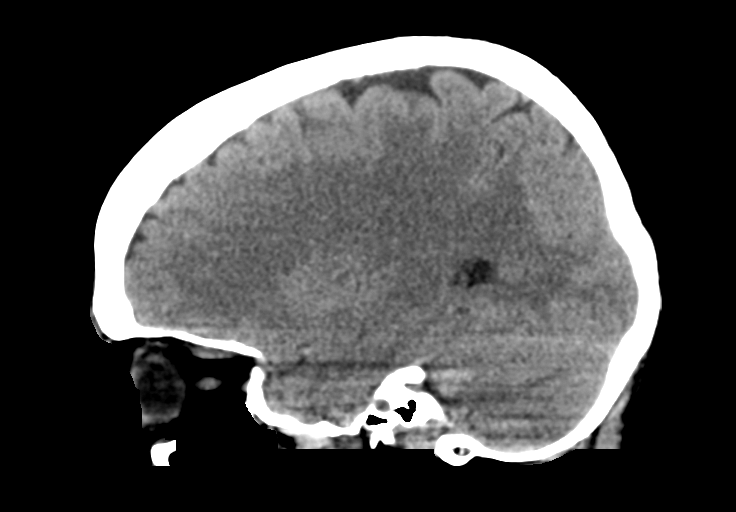
[im 26/52  brain]
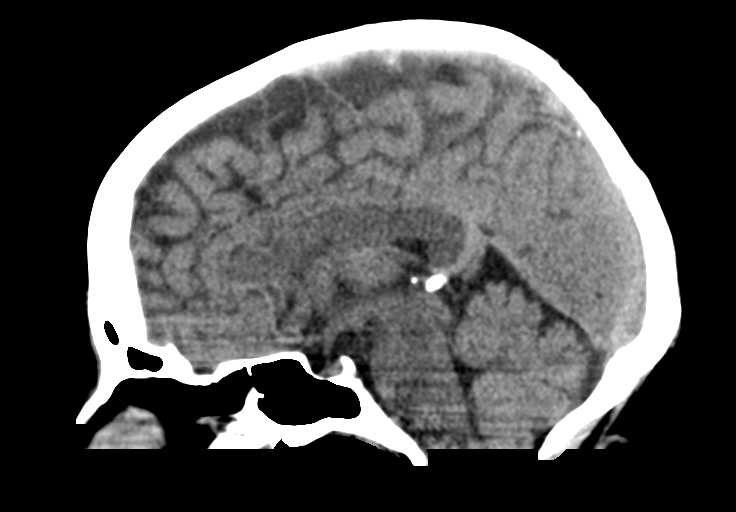
[im 35/52  brain]
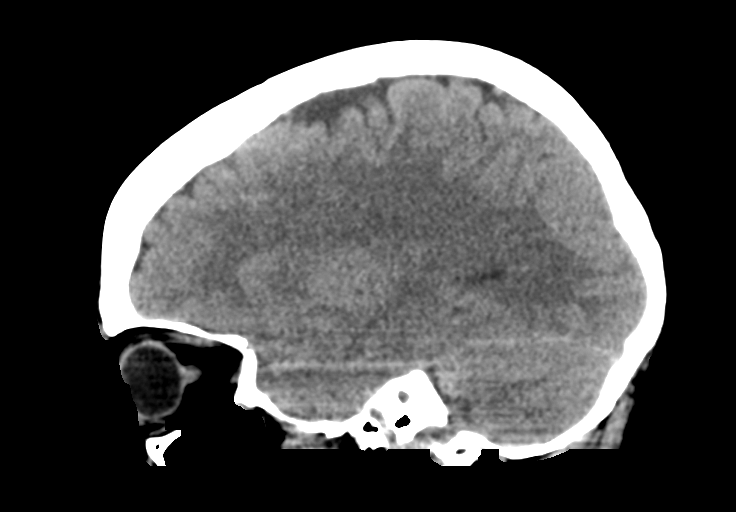

[15 of 47 positions shown; findings below may reference images not displayed]

FINDINGS: Brain: No evidence of acute infarction, hemorrhage, hydrocephalus,
extra-axial collection or mass lesion/mass effect.

Vascular: No hyperdense vessel or unexpected calcification.

Skull: Normal. Negative for fracture or focal lesion.

Sinuses/Orbits: Negative

Other: Critical Value/emergent results were called by telephone at
the time of interpretation on 11/12/2019 at [DATE] to provider Chiu
, who verbally acknowledged these results.

ASPECTS ([facility] Stroke Program Early CT Score)

- Ganglionic level infarction (caudate, lentiform nuclei, internal
capsule, insula, M1-M3 cortex): 7

- Supraganglionic infarction (M4-M6 cortex): 3

Total score (0-10 with 10 being normal): 10
IMPRESSION: 1. Negative head CT.
2. ASPECTS is 10.

## 2021-07-26 ENCOUNTER — Ambulatory Visit (HOSPITAL_COMMUNITY): Admit: 2021-07-26 | Discharge status: Against Medical Advice | Payer: 59
